# Patient Record
Sex: Female | Born: 1995 | Race: Black or African American | Hispanic: No | Marital: Single | Smoking: Current every day smoker
Health system: Southern US, Community
[De-identification: ages and names within clinical notes are randomized; demographics above are authoritative.]

## PROBLEM LIST (undated history)

## (undated) ENCOUNTER — Inpatient Hospital Stay (HOSPITAL_COMMUNITY): Payer: Self-pay

## (undated) DIAGNOSIS — R51 Headache: Secondary | ICD-10-CM

## (undated) DIAGNOSIS — B999 Unspecified infectious disease: Secondary | ICD-10-CM

## (undated) DIAGNOSIS — D649 Anemia, unspecified: Secondary | ICD-10-CM

## (undated) DIAGNOSIS — R519 Headache, unspecified: Secondary | ICD-10-CM

## (undated) HISTORY — PX: WISDOM TOOTH EXTRACTION: SHX21

## (undated) HISTORY — PX: NO PAST SURGERIES: SHX2092

---
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
## 2017-01-31 ENCOUNTER — Encounter (HOSPITAL_COMMUNITY): Payer: Self-pay | Admitting: Emergency Medicine

## 2017-01-31 ENCOUNTER — Emergency Department (HOSPITAL_COMMUNITY)
Admission: EM | Admit: 2017-01-31 | Discharge: 2017-01-31 | Disposition: A | Discharge status: Home / Self Care | Payer: Medicaid Other | Attending: Emergency Medicine | Admitting: Emergency Medicine

## 2017-01-31 DIAGNOSIS — Z3A00 Weeks of gestation of pregnancy not specified: Secondary | ICD-10-CM | POA: Insufficient documentation

## 2017-01-31 DIAGNOSIS — O219 Vomiting of pregnancy, unspecified: Secondary | ICD-10-CM | POA: Insufficient documentation

## 2017-01-31 DIAGNOSIS — Z87891 Personal history of nicotine dependence: Secondary | ICD-10-CM | POA: Insufficient documentation

## 2017-01-31 DIAGNOSIS — Z3490 Encounter for supervision of normal pregnancy, unspecified, unspecified trimester: Secondary | ICD-10-CM

## 2017-01-31 DIAGNOSIS — R112 Nausea with vomiting, unspecified: Secondary | ICD-10-CM

## 2017-01-31 DIAGNOSIS — O2310 Infections of bladder in pregnancy, unspecified trimester: Secondary | ICD-10-CM | POA: Insufficient documentation

## 2017-01-31 DIAGNOSIS — N3000 Acute cystitis without hematuria: Secondary | ICD-10-CM

## 2017-01-31 LAB — URINALYSIS, ROUTINE W REFLEX MICROSCOPIC
Bilirubin Urine: NEGATIVE
GLUCOSE, UA: NEGATIVE mg/dL
HGB URINE DIPSTICK: NEGATIVE
KETONES UR: NEGATIVE mg/dL
NITRITE: NEGATIVE
PH: 6 (ref 5.0–8.0)
Protein, ur: NEGATIVE mg/dL
Specific Gravity, Urine: 1.011 (ref 1.005–1.030)

## 2017-01-31 LAB — PREGNANCY, URINE: Preg Test, Ur: POSITIVE — AB

## 2017-01-31 MED ORDER — SODIUM CHLORIDE 0.9 % IV BOLUS (SEPSIS): 1000.0000 mL | Freq: Once | INTRAVENOUS | Status: DC

## 2017-01-31 MED ORDER — ONDANSETRON HCL 4 MG/2ML IJ SOLN
4.0000 mg | Freq: Once | INTRAMUSCULAR | Status: DC
  Filled 2017-01-31: qty 2

## 2017-01-31 MED ORDER — ONDANSETRON 4 MG PO TBDP: 4.0000 mg | ORAL_TABLET | Freq: Once | ORAL | Status: DC | PRN

## 2017-01-31 MED ORDER — DOXYLAMINE-PYRIDOXINE 10-10 MG PO TBEC: 1.0000 | DELAYED_RELEASE_TABLET | Freq: Three times a day (TID) | ORAL | 0 refills | Status: DC | PRN

## 2017-01-31 MED ORDER — ONDANSETRON 4 MG PO TBDP
4.0000 mg | ORAL_TABLET | Freq: Once | ORAL | Status: AC
  Administered 2017-01-31: 4 mg via ORAL
  Filled 2017-01-31: qty 1

## 2017-01-31 MED ORDER — CEPHALEXIN 500 MG PO CAPS: 500.0000 mg | ORAL_CAPSULE | Freq: Two times a day (BID) | ORAL | 0 refills | Status: DC

## 2017-01-31 NOTE — ED Provider Notes (Signed)
Emergency Department Provider Note   I have reviewed the triage vital signs and the nursing notes.   HISTORY  Chief Complaint Generalized Body Aches and Emesis   HPI Priscilla [name] is a 21 y.o. female presents to the emergency room in for evaluation of generalized abdominal discomfort with associated nausea, vomiting, diarrhea. He has had 3 days of symptoms have become progressively worse. She has intermittent headache and body aches as well. No sick contacts. She's not been able to tolerate PO (solids or liquids). Denies any fever but has had some chills. No dysuria, hesitancy, urgency. No vaginal bleeding or discharge. She's not seen any blood in her emesis. Denies chest pain or difficulty breathing.    Past Medical History:  Diagnosis Date  . Headache   . Infection    UTI, was dx yesterday  . Medical history non-contributory     Patient Active Problem List   Diagnosis Date Noted  . Normal delivery 12/22/2012  . ALLERGIC RHINITIS 03/22/2006  . STRABISMUS 02/25/2002    Past Surgical History:  Procedure Laterality Date  . NO PAST SURGERIES        Allergies Chocolate  Family History  Problem Relation Age of Onset  . Asthma Sister   . Diabetes Maternal Grandmother   . COPD Neg Hx   . Hypertension Neg Hx   . Stroke Neg Hx     Social History Social History  Substance Use Topics  . Smoking status: Former Smoker    Years: 1.00    Types: Cigarettes  . Smokeless tobacco: Never Used     Comment: quit this past wk  . Alcohol use No    Review of Systems  Constitutional: No fever. Positive chills and body aches.  Eyes: No visual changes. ENT: No sore throat. Cardiovascular: Denies chest pain. Respiratory: Denies shortness of breath. Gastrointestinal: Positive generalized abdominal pain. Positive nausea and vomiting.  No diarrhea.  No constipation. Genitourinary: Negative for dysuria. Musculoskeletal: Negative for back pain. Skin: Negative for  rash. Neurological: Negative for headaches, focal weakness or numbness.  10-point ROS otherwise negative.  ____________________________________________   PHYSICAL EXAM:  VITAL SIGNS: ED Triage Vitals  Enc Vitals Group     BP 01/31/17 0853 135/84     Pulse Rate 01/31/17 0853 89     Resp 01/31/17 0853 16     Temp 01/31/17 0851 98.9 F (37.2 C)     Temp Source 01/31/17 0851 Oral     SpO2 01/31/17 0853 100 %     Pain Score 01/31/17 0851 10   Constitutional: Alert and oriented. Well appearing and in no acute distress. Eyes: Conjunctivae are normal. Head: Atraumatic. Nose: No congestion/rhinnorhea. Mouth/Throat: Mucous membranes are moist.  Oropharynx non-erythematous. Neck: No stridor. Cardiovascular: Normal rate, regular rhythm. Good peripheral circulation. Grossly normal heart sounds.   Respiratory: Normal respiratory effort.  No retractions. Lungs CTAB. Gastrointestinal: Soft and nontender. No distention. No CVA tenderness.  Musculoskeletal: No lower extremity tenderness nor edema. No gross deformities of extremities. Neurologic:  Normal speech and language. No gross focal neurologic deficits are appreciated.  Skin:  Skin is warm, dry and intact. No rash noted.  ____________________________________________   LABS (all labs ordered are listed, but only abnormal results are displayed)  Labs Reviewed  URINALYSIS, ROUTINE W REFLEX MICROSCOPIC - Abnormal; Notable for the following:       Result Value   APPearance HAZY (*)    Leukocytes, UA LARGE (*)    Bacteria, UA MANY (*)  Squamous Epithelial / LPF 6-30 (*)    All other components within normal limits  PREGNANCY, URINE - Abnormal; Notable for the following:    Preg Test, Ur POSITIVE (*)    All other components within normal limits   ____________________________________________  RADIOLOGY  None ____________________________________________   PROCEDURES  Procedure(s) performed:    Procedures  None ____________________________________________   INITIAL IMPRESSION / ASSESSMENT AND PLAN / ED COURSE  Pertinent labs & imaging results that were available during my care of the patient were reviewed by me and considered in my medical decision making (see chart for details).  Patient resents to the emergency department for evaluation of generalized abdominal discomfort with nausea, vomiting, diarrhea. Symptoms consistent with a viral enteritis/gastritis. No abdominal tenderness. No pelvic symptoms. No signs or symptoms to suggest sepsis. Plan for IV fluids, nausea medication, labs, and PO challenge.   10:11 AM Updated patient regarding positive pregnancy test. With generalized abdominal discomfort I plan to follow with hCG but have very low clinical suspicion for ectopic pregnancy.   10:15 AM Patient refusing additional blood draw and IV medications. Reports that she has a 35-year-old at home and this is exactly how she felt with that pregnancy. No exam findings concerning for ectopic pregnancy at this time. Patient discomfort is mild and more general. Discussed follow up plan with OB and provided contact information with discharge paperwork. Patient is walking around the ED prior to discharge, tolerating PO, and yelling at someone on the phone. Patient does have some asymptomatic UTI on UA. Plan for Keflex for the next week. Diclegis for nausea and vomiting.   At this time, I do not feel there is any life-threatening condition present. I have reviewed and discussed all results (EKG, imaging, lab, urine as appropriate), exam findings with patient. I have reviewed nursing notes and appropriate previous records.  I feel the patient is safe to be discharged home without further emergent workup. Discussed usual and customary return precautions. Patient and family (if present) verbalize understanding and are comfortable with this plan.  Patient will follow-up with their primary care  provider. If they do not have a primary care provider, information for follow-up has been provided to them. All questions have been answered.  ____________________________________________  FINAL CLINICAL IMPRESSION(S) / ED DIAGNOSES  Final diagnoses:  Non-intractable vomiting with nausea, unspecified vomiting type  Pregnancy, unspecified gestational age  Acute cystitis without hematuria     MEDICATIONS GIVEN DURING THIS VISIT:  Medications  ondansetron (ZOFRAN-ODT) disintegrating tablet 4 mg (4 mg Oral Given 01/31/17 1046)     NEW OUTPATIENT MEDICATIONS STARTED DURING THIS VISIT:  Discharge Medication List as of 01/31/2017 11:19 AM    START taking these medications   Details  cephALEXin (KEFLEX) 500 MG capsule Take 1 capsule (500 mg total) by mouth 2 (two) times daily., Starting Thu 01/31/2017, Until Thu 02/07/2017, Print    Doxylamine-Pyridoxine 10-10 MG TBEC Take 1 tablet by mouth every 8 (eight) hours as needed., Starting Thu 01/31/2017, Until Sat 03/02/2017, Print          Note:  This document was prepared using Dragon voice recognition software and may include unintentional dictation errors.  Nanda Quinton, MD Emergency Medicine  Bobette Leyh, [facility], MD 02/01/17 [phone]

## 2017-01-31 NOTE — ED Notes (Signed)
Pt refused discharge vitals. Says she is 'ready to go'. Pt previously yelling and screaming at someone on the phone. Pt continues to be visibly upset.

## 2017-01-31 NOTE — ED Notes (Signed)
Pt informed by Dr. Laverta Baltimore of positive pregnancy result. Pt tearful. Offered community resources for pregnancy in teens. Pt declined.

## 2017-01-31 NOTE — ED Triage Notes (Signed)
Pt c/o generalized body aches and vomiting x 3 days. Pt with headache since today. Pt has tried Ibuprofen and Aleve at home with no relief.

## 2017-01-31 NOTE — ED Notes (Signed)
Pt refused additional testing or treatments. Requested medications for nausea in pregnancy.

## 2017-01-31 NOTE — Discharge Instructions (Signed)
You have been seen in the Emergency Department (ED) today for nausea and vomiting.  Your work up today has not shown a clear cause for your symptoms. You have been prescribed Diclegis; please use as prescribed as needed for your nausea.  We also found that you are pregnant. You refused further confirmatory testing but you should follow up with an OB/Gyn ASAP. Return to the ED immediately with any worsening abdominal pain. We are also treating a possible urine infection with 1 week of antibiotic.   Follow up with your doctor as soon as possible regarding today?s emergent visit and your symptoms of nausea.   Return to the ED if you develop abdominal, bloody vomiting, bloody diarrhea, if you are unable to tolerate fluids due to vomiting, or if you develop other symptoms that concern you.

## 2017-02-01 ENCOUNTER — Encounter (HOSPITAL_COMMUNITY): Payer: Self-pay | Admitting: *Deleted

## 2017-02-01 ENCOUNTER — Inpatient Hospital Stay (HOSPITAL_COMMUNITY)
Admission: AD | Admit: 2017-02-01 | Discharge: 2017-02-01 | Disposition: A | Discharge status: Home / Self Care | Payer: Medicaid Other | Source: Ambulatory Visit | Attending: Obstetrics & Gynecology | Admitting: Obstetrics & Gynecology

## 2017-02-01 ENCOUNTER — Inpatient Hospital Stay (HOSPITAL_COMMUNITY): Payer: Self-pay

## 2017-02-01 DIAGNOSIS — O200 Threatened abortion: Secondary | ICD-10-CM

## 2017-02-01 DIAGNOSIS — O26899 Other specified pregnancy related conditions, unspecified trimester: Secondary | ICD-10-CM

## 2017-02-01 DIAGNOSIS — O26891 Other specified pregnancy related conditions, first trimester: Secondary | ICD-10-CM | POA: Insufficient documentation

## 2017-02-01 DIAGNOSIS — Z3A01 Less than 8 weeks gestation of pregnancy: Secondary | ICD-10-CM | POA: Insufficient documentation

## 2017-02-01 DIAGNOSIS — O209 Hemorrhage in early pregnancy, unspecified: Secondary | ICD-10-CM

## 2017-02-01 DIAGNOSIS — R109 Unspecified abdominal pain: Secondary | ICD-10-CM | POA: Insufficient documentation

## 2017-02-01 DIAGNOSIS — O219 Vomiting of pregnancy, unspecified: Secondary | ICD-10-CM | POA: Insufficient documentation

## 2017-02-01 DIAGNOSIS — Z87891 Personal history of nicotine dependence: Secondary | ICD-10-CM | POA: Insufficient documentation

## 2017-02-01 HISTORY — DX: Headache, unspecified: R51.9

## 2017-02-01 HISTORY — DX: Headache: R51

## 2017-02-01 HISTORY — DX: Unspecified infectious disease: B99.9

## 2017-02-01 LAB — BASIC METABOLIC PANEL
ANION GAP: 8 (ref 5–15)
BUN: 6 mg/dL (ref 6–20)
CO2: 23 mmol/L (ref 22–32)
Calcium: 8.8 mg/dL — ABNORMAL LOW (ref 8.9–10.3)
Chloride: 104 mmol/L (ref 101–111)
Creatinine, Ser: 0.71 mg/dL (ref 0.44–1.00)
Glucose, Bld: 96 mg/dL (ref 65–99)
POTASSIUM: 3.6 mmol/L (ref 3.5–5.1)
SODIUM: 135 mmol/L (ref 135–145)

## 2017-02-01 LAB — WET PREP, GENITAL
CLUE CELLS WET PREP: NONE SEEN
Sperm: NONE SEEN
TRICH WET PREP: NONE SEEN
YEAST WET PREP: NONE SEEN

## 2017-02-01 LAB — URINALYSIS, ROUTINE W REFLEX MICROSCOPIC
Bilirubin Urine: NEGATIVE
GLUCOSE, UA: NEGATIVE mg/dL
HGB URINE DIPSTICK: NEGATIVE
Ketones, ur: 40 mg/dL — AB
Leukocytes, UA: NEGATIVE
Nitrite: NEGATIVE
PH: 7 (ref 5.0–8.0)
PROTEIN: NEGATIVE mg/dL
SPECIFIC GRAVITY, URINE: 1.015 (ref 1.005–1.030)

## 2017-02-01 LAB — CBC
HCT: 37.8 % (ref 36.0–46.0)
HEMOGLOBIN: 12.9 g/dL (ref 12.0–15.0)
MCH: 26.7 pg (ref 26.0–34.0)
MCHC: 34.1 g/dL (ref 30.0–36.0)
MCV: 78.3 fL (ref 78.0–100.0)
PLATELETS: 318 10*3/uL (ref 150–400)
RBC: 4.83 MIL/uL (ref 3.87–5.11)
RDW: 15.4 % (ref 11.5–15.5)
WBC: 11.1 10*3/uL — ABNORMAL HIGH (ref 4.0–10.5)

## 2017-02-01 LAB — HCG, QUANTITATIVE, PREGNANCY: HCG, BETA CHAIN, QUANT, S: 32102 m[IU]/mL — AB (ref <5)

## 2017-02-01 MED ORDER — FAMOTIDINE IN NACL 20-0.9 MG/50ML-% IV SOLN
20.0000 mg | Freq: Once | INTRAVENOUS | Status: AC
  Administered 2017-02-01: 20 mg via INTRAVENOUS
  Filled 2017-02-01: qty 50

## 2017-02-01 MED ORDER — DEXTROSE 5 % IN LACTATED RINGERS IV BOLUS
1000.0000 mL | Freq: Once | INTRAVENOUS | Status: AC
  Administered 2017-02-01: 1000 mL via INTRAVENOUS

## 2017-02-01 MED ORDER — PROMETHAZINE HCL 25 MG/ML IJ SOLN
12.5000 mg | Freq: Once | INTRAMUSCULAR | Status: AC
  Administered 2017-02-01: 12.5 mg via INTRAVENOUS
  Filled 2017-02-01: qty 1

## 2017-02-01 MED ORDER — PROMETHAZINE HCL 25 MG PO TABS: 25.0000 mg | ORAL_TABLET | Freq: Four times a day (QID) | ORAL | 0 refills | Status: DC | PRN

## 2017-02-01 NOTE — MAU Note (Addendum)
Yesterday, she found out she was pregnant.  Doesn't know how far along she is.  Keeps throwing up. Was dx with a UTI,  Did not get meds, was told it would be over $100.

## 2017-02-01 NOTE — Progress Notes (Addendum)
IV started. D5LR bolus per order.   1150: up to bathroom  1310: pt back from U/s  See previous progress notes from here on out.

## 2017-02-01 NOTE — MAU Provider Note (Signed)
History     CSN: [id number]  Arrival date and time: 02/01/17 0865  First Provider Initiated Contact with Patient 02/01/17 1050      Chief Complaint  Patient presents with  . Emesis   HPI Priscilla [name] is a 21 y.o. G2P1001 at [redacted]w[redacted]d by LMP who presents with nausea & vomiting. Symptoms began earlier this week. Reports vomiting 20+ times today and unable to keep anything down since yesterday.  Endorses lower abdominal pain x 4 days that is constant & describes as sharp & aching. Has not treated. Rates pain 8/10. Pink/red spotting in underwear since yesterday.  Denies fever/chills, diarrhea, dysuria or vaginal discharge. Went to ED yesterday & was prescribed diclegis -- currently no insurance so couldn't afford medication. Was also rx antibiotic for suspected UTI. Denies urinary symptoms. Couldn't afford abx either.   OB History    Gravida Para Term Preterm AB Living   2 1 1     1    SAB TAB Ectopic Multiple Live Births           1      Past Medical History:  Diagnosis Date  . Headache   . Infection    UTI, was dx yesterday  . Medical history non-contributory     Past Surgical History:  Procedure Laterality Date  . NO PAST SURGERIES      Family History  Problem Relation Age of Onset  . Asthma Sister   . Diabetes Maternal Grandmother   . COPD Neg Hx   . Hypertension Neg Hx   . Stroke Neg Hx     Social History  Substance Use Topics  . Smoking status: Former Smoker    Years: 1.00    Types: Cigarettes  . Smokeless tobacco: Never Used     Comment: quit this past wk  . Alcohol use No    Allergies:  Allergies  Allergen Reactions  . Chocolate Other (See Comments)    Gives the patient a really bad headache    Prescriptions Prior to Admission  Medication Sig Dispense Refill Last Dose  . cephALEXin (KEFLEX) 500 MG capsule Take 1 capsule (500 mg total) by mouth 2 (two) times daily. 14 capsule 0   . Doxylamine-Pyridoxine 10-10 MG TBEC Take 1 tablet by mouth every 8  (eight) hours as needed. 60 tablet 0   . ibuprofen (ADVIL,MOTRIN) 200 MG tablet Take 800-1,000 mg by mouth every 6 (six) hours as needed for headache.    01/30/2017 at Unknown time  . ibuprofen (ADVIL,MOTRIN) 600 MG tablet Take 1 tablet (600 mg total) by mouth every 6 (six) hours as needed for pain. (Patient not taking: Reported on 01/31/2017) 40 tablet 5 Not Taking at Unknown time  . Iron-FA-B Cmp-C-Biot-Probiotic (FUSION PLUS) CAPS Take 1 capsule by mouth daily before breakfast. (Patient not taking: Reported on 01/31/2017) 30 capsule 5 Not Taking at Unknown time  . medroxyPROGESTERone (DEPO-PROVERA) 150 MG/ML injection Inject 1 mL (150 mg total) into the muscle every 3 (three) months. (Patient not taking: Reported on 01/31/2017) 1 mL 4 Not Taking at Unknown time  . naproxen sodium (ALEVE) 220 MG tablet Take 440 mg by mouth 2 (two) times daily as needed (headache).   Past Week at Unknown time  . ondansetron (ZOFRAN) 4 MG tablet Take 1 tablet (4 mg total) by mouth every 8 (eight) hours as needed for nausea or vomiting. (Patient not taking: Reported on 01/31/2017) 10 tablet 0 Not Taking at Unknown time  . oxyCODONE-acetaminophen (PERCOCET/ROXICET) 5-325  MG per tablet Take 1-2 tablets by mouth every 4 (four) hours as needed. (Patient not taking: Reported on 01/31/2017) 40 tablet 0 Not Taking at Unknown time  . traZODone (DESYREL) 50 MG tablet Take 1 tablet (50 mg total) by mouth at bedtime as needed for sleep. (Patient not taking: Reported on 01/31/2017) 10 tablet 0 Not Taking at Unknown time    Review of Systems  Constitutional: Negative.   Gastrointestinal: Positive for abdominal pain, nausea and vomiting. Negative for constipation and diarrhea.  Genitourinary: Positive for vaginal bleeding. Negative for dysuria, flank pain, frequency, hematuria and vaginal discharge.   Physical Exam   Blood pressure (!) 104/48, pulse 84, temperature 98.6 F (37 C), temperature source Oral, resp. rate 18, weight 166 lb 8  oz (75.5 kg), last menstrual period 12/18/2016, SpO2 100 %.  Physical Exam  Nursing note and vitals reviewed. Constitutional: She is oriented to person, place, and time. She appears well-developed and well-nourished. No distress.  HENT:  Head: Normocephalic and atraumatic.  Eyes: Conjunctivae are normal. Right eye exhibits no discharge. Left eye exhibits no discharge. No scleral icterus.  Neck: Normal range of motion.  Cardiovascular: Normal rate, regular rhythm and normal heart sounds.   No murmur heard. Respiratory: Effort normal and breath sounds normal. No respiratory distress. She has no wheezes.  GI: Soft. Bowel sounds are normal. She exhibits no distension. There is no tenderness. There is no rebound and no guarding.  Genitourinary: Uterus normal. Cervix exhibits no motion tenderness and no friability. Right adnexum displays no mass and no tenderness. Left adnexum displays no mass and no tenderness. No bleeding in the vagina. Vaginal discharge (small amount of thick white discharge) found.  Genitourinary Comments: Minimal spot of bright red blood on right vaginal wall near introitus. No cervical or uterine bleeding. Cervix closed  Neurological: She is alert and oriented to person, place, and time.  Skin: Skin is warm and dry. She is not diaphoretic.  Psychiatric: She has a normal mood and affect. Her behavior is normal. Judgment and thought content normal.    MAU Course  Procedures Results for orders placed or performed during the hospital encounter of 02/01/17 (from the past 24 hour(s))  Urinalysis, Routine w reflex microscopic     Status: Abnormal   Collection Time: 02/01/17 10:07 AM  Result Value Ref Range   Color, Urine YELLOW YELLOW   APPearance CLEAR CLEAR   Specific Gravity, Urine 1.015 1.005 - 1.030   pH 7.0 5.0 - 8.0   Glucose, UA NEGATIVE NEGATIVE mg/dL   Hgb urine dipstick NEGATIVE NEGATIVE   Bilirubin Urine NEGATIVE NEGATIVE   Ketones, ur 40 (A) NEGATIVE mg/dL    Protein, ur NEGATIVE NEGATIVE mg/dL   Nitrite NEGATIVE NEGATIVE   Leukocytes, UA NEGATIVE NEGATIVE  Wet prep, genital     Status: Abnormal   Collection Time: 02/01/17 11:04 AM  Result Value Ref Range   Yeast Wet Prep HPF POC NONE SEEN NONE SEEN   Trich, Wet Prep NONE SEEN NONE SEEN   Clue Cells Wet Prep HPF POC NONE SEEN NONE SEEN   WBC, Wet Prep HPF POC MODERATE (A) NONE SEEN   Sperm NONE SEEN   CBC     Status: Abnormal   Collection Time: 02/01/17 11:10 AM  Result Value Ref Range   WBC 11.1 (H) 4.0 - 10.5 K/uL   RBC 4.83 3.87 - 5.11 MIL/uL   Hemoglobin 12.9 12.0 - 15.0 g/dL   HCT 37.8 36.0 - 46.0 %  MCV 78.3 78.0 - 100.0 fL   MCH 26.7 26.0 - 34.0 pg   MCHC 34.1 30.0 - 36.0 g/dL   RDW 15.4 11.5 - 15.5 %   Platelets 318 150 - 400 K/uL  Basic metabolic panel     Status: Abnormal   Collection Time: 02/01/17 11:10 AM  Result Value Ref Range   Sodium 135 135 - 145 mmol/L   Potassium 3.6 3.5 - 5.1 mmol/L   Chloride 104 101 - 111 mmol/L   CO2 23 22 - 32 mmol/L   Glucose, Bld 96 65 - 99 mg/dL   BUN 6 6 - 20 mg/dL   Creatinine, Ser 0.71 0.44 - 1.00 mg/dL   Calcium 8.8 (L) 8.9 - 10.3 mg/dL   GFR calc non Af Amer >60 >60 mL/min   GFR calc Af Amer >60 >60 mL/min   Anion gap 8 5 - 15  hCG, quantitative, pregnancy     Status: Abnormal   Collection Time: 02/01/17 11:10 AM  Result Value Ref Range   hCG, Beta Chain, Quant, S 32,102 (H) <5 mIU/mL   US Ob Comp Less 14 Wks  Result Date: 02/01/2017 CLINICAL DATA:  Abdominal pain and vaginal bleeding in first trimester pregnancy. EXAM: OBSTETRIC <14 WK [place] AND TRANSVAGINAL OB US TECHNIQUE: Both transabdominal and transvaginal ultrasound examinations were performed for complete evaluation of the gestation as well as the maternal uterus, adnexal regions, and pelvic cul-de-sac. Transvaginal technique was performed to assess early pregnancy. COMPARISON:  None. FINDINGS: Intrauterine gestational sac: Single Yolk sac:  Not Visualized. Embryo:  Not  Visualized. MSD: 15  mm   6 w   2  d Subchorionic hemorrhage:  None visualized. Maternal uterus/adnexae: Small left ovarian corpus luteum noted. Normal appearance of right ovary. No no mass or abnormal free fluid identified. IMPRESSION: Probable intrauterine gestational sac, but no yolk sac, fetal pole, or cardiac activity yet visualized. Recommend follow-up quantitative B-HCG levels and follow-up US in 14 days to assess viability. This recommendation follows SRU consensus guidelines: Diagnostic Criteria for Nonviable Pregnancy Early in the First Trimester. [employer] Med 2013; 503:8882-80. Electronically Signed   By: Earle Gell M.D.   On: 02/01/2017 13:30   US Ob Transvaginal  Result Date: 02/01/2017 CLINICAL DATA:  Abdominal pain and vaginal bleeding in first trimester pregnancy. EXAM: OBSTETRIC <14 WK [place] AND TRANSVAGINAL OB US TECHNIQUE: Both transabdominal and transvaginal ultrasound examinations were performed for complete evaluation of the gestation as well as the maternal uterus, adnexal regions, and pelvic cul-de-sac. Transvaginal technique was performed to assess early pregnancy. COMPARISON:  None. FINDINGS: Intrauterine gestational sac: Single Yolk sac:  Not Visualized. Embryo:  Not Visualized. MSD: 15  mm   6 w   2  d Subchorionic hemorrhage:  None visualized. Maternal uterus/adnexae: Small left ovarian corpus luteum noted. Normal appearance of right ovary. No no mass or abnormal free fluid identified. IMPRESSION: Probable intrauterine gestational sac, but no yolk sac, fetal pole, or cardiac activity yet visualized. Recommend follow-up quantitative B-HCG levels and follow-up US in 14 days to assess viability. This recommendation follows SRU consensus guidelines: Diagnostic Criteria for Nonviable Pregnancy Early in the First Trimester. [employer] Med 2013; 034:9179-15. Electronically Signed   By: Earle Gell M.D.   On: 02/01/2017 13:30     MDM +UPT UA, wet prep, GC/chlamydia, CBC, ABO/Rh, quant hCG,  HIV, and [place] today to rule out ectopic pregnancy B positive U/a -- no evidence of UTI -- will send for urine  culture IV fluids, phenergan, & pepcid BHCG 32,102. Ultrasound shows IUGS, no yolk sac, small CLC, no free fluid Discussed patient and results with Dr. Harolyn Rutherford. Will have patient f/u for repeat ultrasound in 7-10 days to assess progress of pregnancy.   Assessment and Plan  A: 1. Threatened miscarriage   2. Vaginal bleeding in pregnancy, first trimester   3. Abdominal pain affecting pregnancy   4. Nausea and vomiting during pregnancy prior to [redacted] weeks gestation    P: Discharge home Urine culture & Gc/ct pending Rx phenergan Outpatient ultrasound ordered in 7-10 days for viability Msg to [facility] for ultrasound results appt Discussed reasons to return to [place] 02/01/2017, 10:49 AM

## 2017-02-01 NOTE — Progress Notes (Addendum)
Assumed care of pt from RN J.Lab at bs.  new orders received to start IV and infuse D5LR, IV phenergan  1144: IV started. D5LR bolus per order.   1I50: up to bathroom  IV phenergan administered.   1310: pt back from U/s  1345: 12.5 mg IV phenergan with NS and pepcid administered per order. Pt stating nausea subsided from first dose and making her sleepy.   1417: pt resting quietly with eyes closed. Pt's mother at bs. Updates given regarding pt as pt's mother is enquiring.    1500: IV d/c'd with tip intact and noted. Discharge instructions given to both pt and her mother. Both verbalized understanding. Pt left unit via ambulatory with her mother.

## 2017-02-01 NOTE — Discharge Instructions (Signed)
°  Return to care   If you have heavier bleeding that soaks through more that 2 pads per hour for an hour or more  If you bleed so much that you feel like you might pass out or you do pass out  If you have significant abdominal pain that is not improved with Tylenol   If you develop a fever > 100.5      Morning Sickness Morning sickness is when you feel sick to your stomach (nauseous) during pregnancy. This nauseous feeling may or may not come with vomiting. It often occurs in the morning but can be a problem any time of day. Morning sickness is most common during the first trimester, but it may continue throughout pregnancy. While morning sickness is unpleasant, it is usually harmless unless you develop severe and continual vomiting (hyperemesis gravidarum). This condition requires more intense treatment. What are the causes? The cause of morning sickness is not completely known but seems to be related to normal hormonal changes that occur in pregnancy. What increases the risk? You are at greater risk if you:  Experienced nausea or vomiting before your pregnancy.  Had morning sickness during a previous pregnancy.  Are pregnant with more than one baby, such as twins. How is this treated? Do not use any medicines (prescription, over-the-counter, or herbal) for morning sickness without first talking to your health care provider. Your health care provider may prescribe or recommend:  Vitamin B6 supplements.  Anti-nausea medicines.  The herbal medicine ginger. Follow these instructions at home:  Only take over-the-counter or prescription medicines as directed by your health care provider.  Taking multivitamins before getting pregnant can prevent or decrease the severity of morning sickness in most women.  Eat a piece of dry toast or unsalted crackers before getting out of bed in the morning.  Eat five or six small meals a day.  Eat dry and bland foods (rice, baked potato).  Foods high in carbohydrates are often helpful.  Do not drink liquids with your meals. Drink liquids between meals.  Avoid greasy, fatty, and spicy foods.  Get someone to cook for you if the smell of any food causes nausea and vomiting.  If you feel nauseous after taking prenatal vitamins, take the vitamins at night or with a snack.  Snack on protein foods (nuts, yogurt, cheese) between meals if you are hungry.  Eat unsweetened gelatins for desserts.  Wearing an acupressure wristband (worn for sea sickness) may be helpful.  Acupuncture may be helpful.  Do not smoke.  Get a humidifier to keep the air in your house free of odors.  Get plenty of fresh air. Contact a health care provider if:  Your home remedies are not working, and you need medicine.  You feel dizzy or lightheaded.  You are losing weight. Get help right away if:  You have persistent and uncontrolled nausea and vomiting.  You pass out (faint). This information is not intended to replace advice given to you by your health care provider. Make sure you discuss any questions you have with your health care provider. Document Released: 10/25/2006 Document Revised: 02/09/2016 Document Reviewed: 02/18/2013 Elsevier Interactive Patient Education  2017 [employer].

## 2017-02-02 ENCOUNTER — Encounter (HOSPITAL_COMMUNITY): Payer: Self-pay | Admitting: Anesthesiology

## 2017-02-02 ENCOUNTER — Encounter (HOSPITAL_COMMUNITY): Payer: Self-pay

## 2017-02-02 ENCOUNTER — Inpatient Hospital Stay (HOSPITAL_COMMUNITY)
Admission: AD | Admit: 2017-02-02 | Discharge: 2017-02-02 | Disposition: A | Discharge status: Home / Self Care | Payer: Medicaid Other | Source: Ambulatory Visit | Attending: Obstetrics and Gynecology | Admitting: Obstetrics and Gynecology

## 2017-02-02 DIAGNOSIS — O26891 Other specified pregnancy related conditions, first trimester: Secondary | ICD-10-CM | POA: Insufficient documentation

## 2017-02-02 DIAGNOSIS — Z8249 Family history of ischemic heart disease and other diseases of the circulatory system: Secondary | ICD-10-CM | POA: Insufficient documentation

## 2017-02-02 DIAGNOSIS — Z79899 Other long term (current) drug therapy: Secondary | ICD-10-CM | POA: Insufficient documentation

## 2017-02-02 DIAGNOSIS — Z87891 Personal history of nicotine dependence: Secondary | ICD-10-CM | POA: Insufficient documentation

## 2017-02-02 DIAGNOSIS — O4690 Antepartum hemorrhage, unspecified, unspecified trimester: Secondary | ICD-10-CM | POA: Insufficient documentation

## 2017-02-02 DIAGNOSIS — O209 Hemorrhage in early pregnancy, unspecified: Secondary | ICD-10-CM

## 2017-02-02 DIAGNOSIS — Z825 Family history of asthma and other chronic lower respiratory diseases: Secondary | ICD-10-CM | POA: Insufficient documentation

## 2017-02-02 DIAGNOSIS — R109 Unspecified abdominal pain: Secondary | ICD-10-CM

## 2017-02-02 DIAGNOSIS — Z833 Family history of diabetes mellitus: Secondary | ICD-10-CM | POA: Insufficient documentation

## 2017-02-02 DIAGNOSIS — Z888 Allergy status to other drugs, medicaments and biological substances status: Secondary | ICD-10-CM | POA: Insufficient documentation

## 2017-02-02 DIAGNOSIS — Z823 Family history of stroke: Secondary | ICD-10-CM | POA: Insufficient documentation

## 2017-02-02 LAB — CULTURE, OB URINE: SPECIAL REQUESTS: NORMAL

## 2017-02-02 LAB — HCG, QUANTITATIVE, PREGNANCY: hCG, Beta Chain, Quant, S: 38416 m[IU]/mL — ABNORMAL HIGH (ref <5)

## 2017-02-02 MED ORDER — ONDANSETRON 4 MG PO TBDP
4.0000 mg | ORAL_TABLET | Freq: Three times a day (TID) | ORAL | 0 refills | Status: DC | PRN
Start: 2017-02-02 — End: 2017-03-03

## 2017-02-02 MED ORDER — METOCLOPRAMIDE HCL 5 MG/ML IJ SOLN
10.0000 mg | Freq: Once | INTRAMUSCULAR | Status: AC
  Administered 2017-02-02: 10 mg via INTRAVENOUS
  Filled 2017-02-02: qty 2

## 2017-02-02 MED ORDER — SODIUM CHLORIDE 0.9 % IV SOLN
8.0000 mg | Freq: Once | INTRAVENOUS | Status: AC
  Administered 2017-02-02: 8 mg via INTRAVENOUS
  Filled 2017-02-02: qty 4

## 2017-02-02 MED ORDER — RANITIDINE HCL 150 MG PO TABS: 150.0000 mg | ORAL_TABLET | Freq: Two times a day (BID) | ORAL | 0 refills | Status: DC

## 2017-02-02 MED ORDER — SCOPOLAMINE 1 MG/3DAYS TD PT72: 1.0000 | MEDICATED_PATCH | TRANSDERMAL | Status: DC

## 2017-02-02 MED ORDER — LACTATED RINGERS IV BOLUS (SEPSIS)
1000.0000 mL | Freq: Once | INTRAVENOUS | Status: AC
  Administered 2017-02-02: 1000 mL via INTRAVENOUS

## 2017-02-02 MED ORDER — FAMOTIDINE IN NACL 20-0.9 MG/50ML-% IV SOLN
20.0000 mg | Freq: Once | INTRAVENOUS | Status: AC
  Administered 2017-02-02: 20 mg via INTRAVENOUS
  Filled 2017-02-02: qty 50

## 2017-02-02 MED ORDER — OXYCODONE-ACETAMINOPHEN 5-325 MG PO TABS
1.0000 | ORAL_TABLET | Freq: Once | ORAL | Status: AC
  Administered 2017-02-02: 1 via ORAL
  Filled 2017-02-02: qty 1

## 2017-02-02 MED ORDER — DEXTROSE 5 % IN LACTATED RINGERS IV BOLUS
1000.0000 mL | Freq: Once | INTRAVENOUS | Status: AC
  Administered 2017-02-02: 1000 mL via INTRAVENOUS

## 2017-02-02 NOTE — Progress Notes (Addendum)
G2P1 @ [redacted]wksga. Back again from yesterday for same thing in addition to scant bleeding that started an hr ago. Scant bleeding noted on pt's undergarment. Pt was on PO phenergan at home and stating not helping with nausea and vomiting. See flow sheet for details of VS. High BP noted   Provider at bs assessing. Orders received to start IV and admin IVP zofran   IV 20 q placed on right AC. flowing slower than normal and pt complaints it hurts. IV d/c'd.   1600: Lab at bs attempting to draw labs. Finally able to at 1607.   1607: another nurse called to start IV. No luck. House coverage called to start IV.   1620: anesthesia at bs  1632: Placed 20 g IV Left front of wrist. Fluid bolus infusing. Zofran started.   1720: pt feeling much better. Resting quietly with eyes clsoed.   1825: another bag of D5LR up hung by another nurse 1800: Pt denies nausea now. PO percocet given.   1900: Provider at bs discussing POC.   Discharge instructions given with pt understanding. Pt left unit via ambulatory.

## 2017-02-02 NOTE — MAU Note (Signed)
Onset of vaginal bleeding about one hour ago after getting out of shower, has not eaten in 3 days, was prescribed Phenergan and she vomits after taking.  Having abdominal pain 10/10

## 2017-02-02 NOTE — Discharge Instructions (Signed)
Nausea and Vomiting, Adult Feeling sick to your stomach (nausea) means that your stomach is upset or you feel like you have to throw up (vomit). Feeling more and more sick to your stomach can lead to throwing up. Throwing up happens when food and liquid from your stomach are thrown up and out the mouth. Throwing up can make you feel weak and cause you to get dehydrated. Dehydration can make you tired and thirsty, make you have a dry mouth, and make it so you pee (urinate) less often. Older adults and people with other diseases or a weak defense system (immune system) are at higher risk for dehydration. If you feel sick to your stomach or if you throw up, it is important to follow instructions from your doctor about how to take care of yourself. Follow these instructions at home: Eating and drinking  Follow these instructions as told by your doctor:  Take an oral rehydration solution (ORS). This is a drink that is sold at pharmacies and stores.  Drink clear fluids in small amounts as you are able, such as:  Water.  Ice chips.  Diluted fruit juice.  Low-calorie sports drinks.  Eat bland, easy-to-digest foods in small amounts as you are able, such as:  Bananas.  Applesauce.  Rice.  Low-fat (lean) meats.  Toast.  Crackers.  Avoid fluids that have a lot of sugar or caffeine in them.  Avoid alcohol.  Avoid spicy or fatty foods. General instructions   Drink enough fluid to keep your pee (urine) clear or pale yellow.  Wash your hands often. If you cannot use soap and water, use hand sanitizer.  Make sure that all people in your home wash their hands well and often.  Take over-the-counter and prescription medicines only as told by your doctor.  Rest at home while you get better.  Watch your condition for any changes.  Breathe slowly and deeply when you feel sick to your stomach.  Keep all follow-up visits as told by your doctor. This is important. Contact a doctor  if:  You have a fever.  You cannot keep fluids down.  Your symptoms get worse.  You have new symptoms.  You feel sick to your stomach for more than two days.  You feel light-headed or dizzy.  You have a headache.  You have muscle cramps. Get help right away if:  You have pain in your chest, neck, arm, or jaw.  You feel very weak or you pass out (faint).  You throw up again and again.  You see blood in your throw-up.  Your throw-up looks like black coffee grounds.  You have bloody or black poop (stools) or poop that look like tar.  You have a very bad headache, a stiff neck, or both.  You have a rash.  You have very bad pain, cramping, or bloating in your belly (abdomen).  You have trouble breathing.  You are breathing very quickly.  Your heart is beating very quickly.  Your skin feels cold and clammy.  You feel confused.  You have pain when you pee.  You have signs of dehydration, such as:  Dark pee, hardly any pee, or no pee.  Cracked lips.  Dry mouth.  Sunken eyes.  Sleepiness.  Weakness. These symptoms may be an emergency. Do not wait to see if the symptoms will go away. Get medical help right away. Call your local emergency services (911 in the [place] Do not drive yourself to the hospital. This information   is not intended to replace advice given to you by your health care provider. Make sure you discuss any questions you have with your health care provider. Document Released: 02/20/2008 Document Revised: 03/23/2016 Document Reviewed: 05/10/2015 Elsevier Interactive Patient Education  2017 [employer] Inc[employer]  

## 2017-02-02 NOTE — MAU Provider Note (Signed)
History     CSN: [id number]  Arrival date and time: 02/02/17 1426   First Provider Initiated Contact with Patient 02/02/17 1534      Chief Complaint  Patient presents with  . Abdominal Pain  . Vaginal Bleeding   HPI   Priscilla [name] is a 21 y.o. female G2P1001 @ [redacted]w[redacted]d here in MAU with onset of bright red vaginal spotting and worsening N/V. She was seen yesterday for abdominal pain and NV. She was given IV fluids and left still feeling sick on her stomach. Since she left yesterday she has vomited over 20 times. Says she has not eaten in over 2 days. She is taking phenergan but does not feel it is working.   OB History    Gravida Para Term Preterm AB Living   2 1 1     1    SAB TAB Ectopic Multiple Live Births           1      Past Medical History:  Diagnosis Date  . Headache   . Infection    UTI, was dx yesterday  . Medical history non-contributory     Past Surgical History:  Procedure Laterality Date  . NO PAST SURGERIES      Family History  Problem Relation Age of Onset  . Asthma Sister   . Diabetes Maternal Grandmother   . COPD Neg Hx   . Hypertension Neg Hx   . Stroke Neg Hx     Social History  Substance Use Topics  . Smoking status: Former Smoker    Years: 1.00    Types: Cigarettes  . Smokeless tobacco: Never Used     Comment: quit this past wk  . Alcohol use No    Allergies:  Allergies  Allergen Reactions  . Chocolate Other (See Comments)    Gives the patient a really bad headache    Prescriptions Prior to Admission  Medication Sig Dispense Refill Last Dose  . Doxylamine-Pyridoxine 10-10 MG TBEC Take 1 tablet by mouth every 8 (eight) hours as needed. 60 tablet 0 01/31/2017 at Unknown time  . promethazine (PHENERGAN) 25 MG tablet Take 1 tablet (25 mg total) by mouth every 6 (six) hours as needed for nausea or vomiting. 30 tablet 0    Results for orders placed or performed during the hospital encounter of 02/02/17 (from the past 48 hour(s))   hCG, quantitative, pregnancy     Status: Abnormal   Collection Time: 02/02/17  4:03 PM  Result Value Ref Range   hCG, Beta Chain, Quant, S 38,416 (H) <5 mIU/mL    Comment:          GEST. AGE      CONC.  (mIU/mL)   <=1 WEEK        5 - 50     2 WEEKS       50 - 500     3 WEEKS       100 - 10,000     4 WEEKS     1,000 - 30,000     5 WEEKS     3,500 - 115,000   6-8 WEEKS     12,000 - 270,000    12 WEEKS     15,000 - 220,000        FEMALE AND NON-PREGNANT FEMALE:     LESS THAN 5 mIU/mL    Results for orders placed or performed during the hospital encounter of 02/02/17 (from the past 48 hour(s))  hCG, quantitative, pregnancy     Status: Abnormal   Collection Time: 02/02/17  4:03 PM  Result Value Ref Range   hCG, Beta Chain, Quant, S 38,416 (H) <5 mIU/mL    Comment:          GEST. AGE      CONC.  (mIU/mL)   <=1 WEEK        5 - 50     2 WEEKS       50 - 500     3 WEEKS       100 - 10,000     4 WEEKS     1,000 - 30,000     5 WEEKS     3,500 - 115,000   6-8 WEEKS     12,000 - 270,000    12 WEEKS     15,000 - 220,000        FEMALE AND NON-PREGNANT FEMALE:     LESS THAN 5 mIU/mL    Review of Systems  Constitutional: Negative for fever.  Gastrointestinal: Negative for abdominal pain and constipation.  Genitourinary: Positive for vaginal bleeding.   Physical Exam   Blood pressure (!) 114/47, pulse 88, temperature 98.6 F (37 C), resp. rate 18, height 5' (1.524 m), weight 166 lb (75.3 kg), last menstrual period 12/18/2016.  Physical Exam  Constitutional: She is oriented to person, place, and time. She appears well-developed and well-nourished. No distress.  HENT:  Head: Normocephalic.  GI: Soft. She exhibits no distension and no mass. There is no tenderness. There is no rebound and no guarding.  Genitourinary:  Genitourinary Comments: Bimanual exam: cervix closed, posterior, scant amount of brown blood noted on exam glove.   Musculoskeletal: Normal range of motion.   Neurological: She is alert and oriented to person, place, and time.  Skin: Skin is warm. She is not diaphoretic.  Psychiatric: Her behavior is normal.    MAU Course  Procedures  None  MDM  LR bolus X1 D5LR bolus X1 Zofran 8 mg IV Pepcid 20 mg IV  Reglan 10 mg IV Percocet 1 tab.  Patient says she is feeling much better. Patient tolerating ice chips.   Assessment and Plan   A:  1. Abdominal pain in pregnancy, first trimester   2. Vaginal bleeding in pregnancy, first trimester     P:  Discharge home in stable condition Rx: Zofran, Zantac Return to MAU if symptoms worsen Patient has [place] scheduled for 7 days Take medication as prescribed Scopolamine patch ordered, however RN informed me that it was not placed at discharge.   Lezlie Lye, NP 02/02/2017 7:26 PM

## 2017-02-04 LAB — GC/CHLAMYDIA PROBE AMP (~~LOC~~) NOT AT ARMC
Chlamydia: NEGATIVE
NEISSERIA GONORRHEA: NEGATIVE

## 2017-02-08 ENCOUNTER — Ambulatory Visit (HOSPITAL_COMMUNITY)
Admission: RE | Admit: 2017-02-08 | Discharge: 2017-02-08 | Disposition: A | Discharge status: Home / Self Care | Payer: Medicaid Other | Source: Ambulatory Visit | Attending: Student | Admitting: Student

## 2017-02-08 DIAGNOSIS — O26899 Other specified pregnancy related conditions, unspecified trimester: Secondary | ICD-10-CM | POA: Insufficient documentation

## 2017-02-08 DIAGNOSIS — R109 Unspecified abdominal pain: Secondary | ICD-10-CM | POA: Insufficient documentation

## 2017-02-08 DIAGNOSIS — O200 Threatened abortion: Secondary | ICD-10-CM | POA: Insufficient documentation

## 2017-02-08 DIAGNOSIS — Z3A00 Weeks of gestation of pregnancy not specified: Secondary | ICD-10-CM | POA: Insufficient documentation

## 2017-03-03 ENCOUNTER — Inpatient Hospital Stay (HOSPITAL_COMMUNITY): Payer: Self-pay

## 2017-03-03 ENCOUNTER — Inpatient Hospital Stay (HOSPITAL_COMMUNITY)
Admission: AD | Admit: 2017-03-03 | Discharge: 2017-03-03 | Disposition: A | Discharge status: Home / Self Care | Payer: Self-pay | Source: Ambulatory Visit | Attending: Obstetrics & Gynecology | Admitting: Obstetrics & Gynecology

## 2017-03-03 ENCOUNTER — Encounter (HOSPITAL_COMMUNITY): Payer: Self-pay

## 2017-03-03 DIAGNOSIS — N390 Urinary tract infection, site not specified: Secondary | ICD-10-CM | POA: Insufficient documentation

## 2017-03-03 DIAGNOSIS — Z87891 Personal history of nicotine dependence: Secondary | ICD-10-CM | POA: Insufficient documentation

## 2017-03-03 DIAGNOSIS — O209 Hemorrhage in early pregnancy, unspecified: Secondary | ICD-10-CM

## 2017-03-03 DIAGNOSIS — O034 Incomplete spontaneous abortion without complication: Secondary | ICD-10-CM

## 2017-03-03 DIAGNOSIS — O039 Complete or unspecified spontaneous abortion without complication: Secondary | ICD-10-CM | POA: Insufficient documentation

## 2017-03-03 LAB — URINALYSIS, ROUTINE W REFLEX MICROSCOPIC
BACTERIA UA: NONE SEEN
BILIRUBIN URINE: NEGATIVE
Glucose, UA: NEGATIVE mg/dL
KETONES UR: NEGATIVE mg/dL
Nitrite: NEGATIVE
PH: 6 (ref 5.0–8.0)
Protein, ur: NEGATIVE mg/dL
SPECIFIC GRAVITY, URINE: 1.017 (ref 1.005–1.030)

## 2017-03-03 MED ORDER — IBUPROFEN 600 MG PO TABS: 600.0000 mg | ORAL_TABLET | Freq: Four times a day (QID) | ORAL | 0 refills | Status: DC | PRN

## 2017-03-03 MED ORDER — MISOPROSTOL 200 MCG PO TABS
800.0000 ug | ORAL_TABLET | Freq: Once | ORAL | Status: AC
  Administered 2017-03-03: 800 ug via BUCCAL
  Filled 2017-03-03: qty 4

## 2017-03-03 NOTE — Discharge Instructions (Signed)
Expect the clinic to call you this week and schedule you an appointment to be seen in 2 weeks. Get your medication from your pharmacy to take to help with cramping.  Take this medicine with food. Return if you develop fever, severe vaginal bleeding with dizziness. Drink at least 8 8-oz glasses of water every day. Take Tylenol 325 mg 2 tablets by mouth every 4 hours if needed for pain. You can take this along with the ibuprofen.

## 2017-03-03 NOTE — MAU Provider Note (Signed)
History     CSN: [id number]  Arrival date and time: 03/03/17 4259   First Provider Initiated Contact with Patient 03/03/17 1029      Chief Complaint  Patient presents with  . Vaginal Bleeding   HPI Priscilla [name] 21 y.o.  Comes to MAU today with increased vaginal bleeding and abdominal cramping especially on the left side.  Bleeding was heavier yesterday reporting similar to a menstrual period but today did not wear a pad when coming in to the hospital.  Was here last on 02-02-17 and note was reviewed.  Blood type is B positive.   OB History    Gravida Para Term Preterm AB Living   2 1 1     1    SAB TAB Ectopic Multiple Live Births           1      Past Medical History:  Diagnosis Date  . Headache   . Infection    UTI, was dx yesterday    Past Surgical History:  Procedure Laterality Date  . WISDOM TOOTH EXTRACTION      Family History  Problem Relation Age of Onset  . Asthma Sister   . Diabetes Maternal Grandmother   . COPD Neg Hx   . Hypertension Neg Hx   . Stroke Neg Hx     Social History  Substance Use Topics  . Smoking status: Former Smoker    Years: 1.00    Types: Cigarettes  . Smokeless tobacco: Never Used     Comment: quit this past wk  . Alcohol use No    Allergies:  Allergies  Allergen Reactions  . Chocolate Other (See Comments)    Reaction:  Headaches     Prescriptions Prior to Admission  Medication Sig Dispense Refill Last Dose  . ondansetron (ZOFRAN ODT) 4 MG disintegrating tablet Take 1 tablet (4 mg total) by mouth every 8 (eight) hours as needed for nausea or vomiting. 20 tablet 0   . promethazine (PHENERGAN) 25 MG tablet Take 1 tablet (25 mg total) by mouth every 6 (six) hours as needed for nausea or vomiting. 30 tablet 0 02/02/2017 at Unknown time  . ranitidine (ZANTAC) 150 MG tablet Take 1 tablet (150 mg total) by mouth 2 (two) times daily. 60 tablet 0     Review of Systems  Constitutional: Negative for fever.  Genitourinary:  Positive for vaginal bleeding.       Pain on lower left side   Physical Exam   Blood pressure 129/85, pulse (!) 109, temperature 98.9 F (37.2 C), temperature source Oral, resp. rate 16, height 5\' 1"  (1.549 m), weight 169 lb 0.6 oz (76.7 kg), last menstrual period 12/18/2016.  Physical Exam  Nursing note and vitals reviewed. Constitutional: She is oriented to person, place, and time. She appears well-developed and well-nourished.  HENT:  Head: Normocephalic.  Eyes: EOM are normal.  Neck: Neck supple.  GI: Soft. There is no tenderness.  Unable to hear FHT with doppler  Musculoskeletal: Normal range of motion.  Neurological: She is alert and oriented to person, place, and time.  Skin: Skin is warm and dry.  Psychiatric: She has a normal mood and affect.    MAU Course  Procedures Preliminary ultrasound report reviewed - only mobile IUGS sac seen today when yolk sac seen at last ultrasound on 02-08-17.  Result is failed pregnancy  MDM 1150  Discussed ultrasound results and plan of care with Dr. Harolyn Rutherford 1230  Discussed ultrasound results from  3 weeks ago and compared to today's ultrasound.  Client tearful and wanting to discuss plans with her mother by phone.  Discussed expectant management and cytotec medication. Decided to get Cytotec here today.  Blood type B positive.  HGB on 02-01-17 was 12.9  Has had minimal vaginal bleeding yesterday.  Assessment and Plan  Failed pregnancy - beginning miscarriage  Plan Cytotec 800 mcg given buccally in MAU Message sent to clinic to schedule for follow up in 2 weeks. Drink at least 8 8-oz glasses of water every day. Prescribed ibuprofen to her pharmacy to take for cramping.   Terri L Burleson 03/03/2017, 11:04 AM

## 2017-03-03 NOTE — MAU Note (Signed)
Started bleeding yesterday like a period, only used one pad, not wearing one currently.

## 2017-03-09 ENCOUNTER — Inpatient Hospital Stay (HOSPITAL_COMMUNITY)
Admission: AD | Admit: 2017-03-09 | Discharge: 2017-03-09 | Disposition: A | Discharge status: Home / Self Care | Payer: Medicaid Other | Source: Ambulatory Visit | Attending: Obstetrics and Gynecology | Admitting: Obstetrics and Gynecology

## 2017-03-09 DIAGNOSIS — O039 Complete or unspecified spontaneous abortion without complication: Secondary | ICD-10-CM

## 2017-03-09 DIAGNOSIS — Z79899 Other long term (current) drug therapy: Secondary | ICD-10-CM | POA: Insufficient documentation

## 2017-03-09 DIAGNOSIS — Z87891 Personal history of nicotine dependence: Secondary | ICD-10-CM | POA: Insufficient documentation

## 2017-03-09 DIAGNOSIS — K047 Periapical abscess without sinus: Secondary | ICD-10-CM

## 2017-03-09 DIAGNOSIS — R51 Headache: Secondary | ICD-10-CM | POA: Insufficient documentation

## 2017-03-09 MED ORDER — OXYCODONE-ACETAMINOPHEN 5-325 MG PO TABS
2.0000 | ORAL_TABLET | Freq: Once | ORAL | Status: AC
  Administered 2017-03-09: 2 via ORAL
  Filled 2017-03-09: qty 2

## 2017-03-09 MED ORDER — HYDROCODONE-ACETAMINOPHEN 5-325 MG PO TABS: 1.0000 | ORAL_TABLET | ORAL | 0 refills | Status: DC | PRN

## 2017-03-09 MED ORDER — AMOXICILLIN-POT CLAVULANATE 875-125 MG PO TABS: 1.0000 | ORAL_TABLET | Freq: Two times a day (BID) | ORAL | 0 refills | Status: DC

## 2017-03-09 NOTE — Discharge Instructions (Signed)
Dental Abscess A dental abscess is pus in or around a tooth. Follow these instructions at home:  Take medicines only as told by your dentist.  If you were prescribed antibiotic medicine, finish all of it even if you start to feel better.  Rinse your mouth (gargle) often with salt water.  Do not drive or use heavy machinery, like a lawn mower, while taking pain medicine.  Do not apply heat to the outside of your mouth.  Keep all follow-up visits as told by your dentist. This is important. Contact a doctor if:  Your pain is worse, and medicine does not help. Get help right away if:  You have a fever or chills.  Your symptoms suddenly get worse.  You have a very bad headache.  You have problems breathing or swallowing.  You have trouble opening your mouth.  You have puffiness (swelling) in your neck or around your eye. This information is not intended to replace advice given to you by your health care provider. Make sure you discuss any questions you have with your health care provider. Document Released: 01/18/2015 Document Revised: 02/09/2016 Document Reviewed: 08/31/2014 Elsevier Interactive Patient Education  2018 [employer] Inc[employer]  

## 2017-03-09 NOTE — MAU Note (Signed)
+   lowerabdominal pain Has persistent since miscarriage x3 days ago per patient Constant Rating pain 10/10 Sharp in nature Has tried tylenol and aleve with no relief.  + right lower jaw/ear pain Pain started today Constant pain--dull  Patient tearful during triage.  States unable to leave urine sample at this time.

## 2017-03-09 NOTE — MAU Provider Note (Signed)
History     CSN: [id number]  Arrival date and time: 03/09/17 1031   None     No chief complaint on file.  HPI   [name] is a 21 y.o. female G2P1001 here in MAU with abdominal pain and tooth pain. She was diagnosed with an SAB on 6/17 and received cytotec in MAU. She had a lot of abdominal pain and rectal pain and passed a large clot. Her bleeding since then has slowed down. She also attests to right tooth pain and right facial pain/ swelling. The pain started yesterday.   OB History    Gravida Para Term Preterm AB Living   2 1 1     1    SAB TAB Ectopic Multiple Live Births           1      Past Medical History:  Diagnosis Date  . Headache   . Infection    UTI, was dx yesterday    Past Surgical History:  Procedure Laterality Date  . WISDOM TOOTH EXTRACTION      Family History  Problem Relation Age of Onset  . Asthma Sister   . Diabetes Maternal Grandmother   . COPD Neg Hx   . Hypertension Neg Hx   . Stroke Neg Hx     Social History  Substance Use Topics  . Smoking status: Former Smoker    Years: 1.00    Types: Cigarettes  . Smokeless tobacco: Never Used     Comment: quit this past wk  . Alcohol use No    Allergies:  Allergies  Allergen Reactions  . Chocolate Other (See Comments)    Reaction:  Migraines    Prescriptions Prior to Admission  Medication Sig Dispense Refill Last Dose  . acetaminophen (TYLENOL) 500 MG tablet Take 1,000 mg by mouth once.   03/08/2017 at Unknown time  . diphenhydramine-acetaminophen (TYLENOL PM) 25-500 MG TABS tablet Take 1 tablet by mouth once.   03/09/2017 at Unknown time  . naproxen sodium (ANAPROX) 220 MG tablet Take 440 mg by mouth once.   03/09/2017 at Unknown time  . promethazine (PHENERGAN) 25 MG tablet Take 1 tablet (25 mg total) by mouth every 6 (six) hours as needed for nausea or vomiting. 30 tablet 0 Past Week at Unknown time  . ibuprofen (ADVIL,MOTRIN) 600 MG tablet Take 1 tablet (600 mg total) by mouth  every 6 (six) hours as needed. (Patient not taking: Reported on 03/09/2017) 30 tablet 0 Not Taking at Unknown time   No results found for this or any previous visit (from the past 48 hour(s)).  Review of Systems  Gastrointestinal: Positive for abdominal pain.  Genitourinary: Positive for vaginal bleeding.   Physical Exam   Blood pressure 128/64, pulse 87, temperature 98.4 F (36.9 C), temperature source Oral, resp. rate 17, weight 172 lb 0.6 oz (78 kg), last menstrual period 12/18/2016, SpO2 100 %.  Physical Exam  Constitutional: She is oriented to person, place, and time. She appears well-developed and well-nourished. No distress.  HENT:  Head: Normocephalic.  Mouth/Throat: Dental abscesses and dental caries present. No uvula swelling.    Respiratory: Effort normal.  GI: Soft.  Genitourinary:  Genitourinary Comments: Vagina - Small amount of brown vaginal discharge, no odor  Cervix - No contact bleeding, no active bleeding  Bimanual exam: Cervix closed Uterus non tender, enlarged  Adnexa non tender, no masses bilaterally Chaperone present for exam.   Musculoskeletal: Normal range of motion.  Neurological: She is  alert and oriented to person, place, and time.  Skin: Skin is warm. She is not diaphoretic.  Psychiatric: Her behavior is normal.    MAU Course  Procedures  None  MDM  Percocet given 2 tabs in MAU B positive blood type.   Assessment and Plan   A:  1. SAB (spontaneous abortion)   2. Tooth abscess     P:  Discharge home in stable condition  Keep your appointment with [facility].  Return to MAU if symptoms worsen Rx: Vicodin, Augmentin

## 2017-03-12 ENCOUNTER — Encounter: Payer: Self-pay | Admitting: Certified Nurse Midwife

## 2017-03-19 ENCOUNTER — Ambulatory Visit: Payer: Medicaid Other | Admitting: Obstetrics

## 2017-03-19 ENCOUNTER — Encounter: Payer: Self-pay | Admitting: *Deleted

## 2017-07-28 ENCOUNTER — Emergency Department (HOSPITAL_COMMUNITY)
Admission: EM | Admit: 2017-07-28 | Discharge: 2017-07-28 | Disposition: A | Discharge status: Home / Self Care | Payer: Medicaid Other | Source: Home / Self Care | Attending: Emergency Medicine | Admitting: Emergency Medicine

## 2017-07-28 ENCOUNTER — Other Ambulatory Visit: Payer: Self-pay

## 2017-07-28 ENCOUNTER — Encounter (HOSPITAL_COMMUNITY): Payer: Self-pay

## 2017-07-28 ENCOUNTER — Emergency Department (HOSPITAL_COMMUNITY)
Admission: EM | Admit: 2017-07-28 | Discharge: 2017-07-28 | Disposition: A | Discharge status: Home / Self Care | Payer: Medicaid Other | Attending: Emergency Medicine | Admitting: Emergency Medicine

## 2017-07-28 ENCOUNTER — Encounter (HOSPITAL_COMMUNITY): Payer: Self-pay | Admitting: Emergency Medicine

## 2017-07-28 DIAGNOSIS — K0889 Other specified disorders of teeth and supporting structures: Secondary | ICD-10-CM | POA: Diagnosis present

## 2017-07-28 DIAGNOSIS — Z87891 Personal history of nicotine dependence: Secondary | ICD-10-CM | POA: Diagnosis not present

## 2017-07-28 DIAGNOSIS — K029 Dental caries, unspecified: Secondary | ICD-10-CM

## 2017-07-28 MED ORDER — IBUPROFEN 600 MG PO TABS: 600.0000 mg | ORAL_TABLET | Freq: Three times a day (TID) | ORAL | 0 refills | Status: DC | PRN

## 2017-07-28 MED ORDER — IBUPROFEN 200 MG PO TABS
600.0000 mg | ORAL_TABLET | Freq: Once | ORAL | Status: AC
  Administered 2017-07-28: 600 mg via ORAL
  Filled 2017-07-28: qty 3

## 2017-07-28 MED ORDER — PENICILLIN V POTASSIUM 500 MG PO TABS: 500.0000 mg | ORAL_TABLET | Freq: Three times a day (TID) | ORAL | 0 refills | Status: DC

## 2017-07-28 MED ORDER — PENICILLIN V POTASSIUM 500 MG PO TABS
500.0000 mg | ORAL_TABLET | Freq: Once | ORAL | Status: AC
  Administered 2017-07-28: 500 mg via ORAL
  Filled 2017-07-28: qty 1

## 2017-07-28 MED ORDER — BUPIVACAINE-EPINEPHRINE (PF) 0.5% -1:200000 IJ SOLN
1.8000 mL | Freq: Once | INTRAMUSCULAR | Status: AC
  Administered 2017-07-28: 1.8 mL
  Filled 2017-07-28 (×2): qty 1.8

## 2017-07-28 MED ORDER — ACETAMINOPHEN 500 MG PO TABS
1000.0000 mg | ORAL_TABLET | Freq: Once | ORAL | Status: AC
  Administered 2017-07-28: 1000 mg via ORAL
  Filled 2017-07-28: qty 2

## 2017-07-28 MED ORDER — ACETAMINOPHEN 325 MG PO TABS
650.0000 mg | ORAL_TABLET | Freq: Once | ORAL | Status: AC
  Administered 2017-07-28: 650 mg via ORAL
  Filled 2017-07-28: qty 2

## 2017-07-28 NOTE — Discharge Instructions (Signed)
Please call a dentist for follow up

## 2017-07-28 NOTE — ED Provider Notes (Signed)
[place] DEPT Provider Note   CSN: [id number] Arrival date & time: 07/28/17  0750     History   Chief Complaint Chief Complaint  Patient presents with  . Dental Pain    HPI Priscilla [name] is a 21 y.o. female.  HPI Patient is a 21 year old female presents the emergency department with right lower dental pain over the past 2 days.  She is called a Pharmacist, community but has not seen them in follow-up yet.  No fevers or chills.  No difficulty breathing or swallowing.  Pain is moderate to severe in severity.  Pain is unrelieved with ibuprofen.   Past Medical History:  Diagnosis Date  . Headache   . Infection    UTI, was dx yesterday    Patient Active Problem List   Diagnosis Date Noted  . Normal delivery 12/22/2012  . ALLERGIC RHINITIS 03/22/2006  . STRABISMUS 02/25/2002    Past Surgical History:  Procedure Laterality Date  . WISDOM TOOTH EXTRACTION      OB History    Gravida Para Term Preterm AB Living   2 1 1     1    SAB TAB Ectopic Multiple Live Births           1       Home Medications    Prior to Admission medications   Medication Sig Start Date End Date Taking? Authorizing Provider  ALPRAZolam Duanne Moron) 1 MG tablet Take 1 mg 2 (two) times daily as needed by mouth for anxiety.  07/24/17  Yes [provider]  amoxicillin-clavulanate (AUGMENTIN) 875-125 MG tablet Take 1 tablet by mouth 2 (two) times daily. 03/09/17  Yes Rasch, Anderson Malta I, NP  ibuprofen (ADVIL,MOTRIN) 200 MG tablet Take 800 mg every 6 (six) hours as needed by mouth for mild pain.   Yes [provider]  naproxen sodium (ANAPROX) 220 MG tablet Take 440 mg by mouth once.   Yes [provider]  HYDROcodone-acetaminophen (NORCO/VICODIN) 5-325 MG tablet Take 1-2 tablets by mouth every 4 (four) hours as needed. Patient not taking: Reported on 07/28/2017 03/09/17   Rasch, Anderson Malta I, NP  ibuprofen (ADVIL,MOTRIN) 600 MG tablet Take 1 tablet (600 mg total)  by mouth every 6 (six) hours as needed. Patient not taking: Reported on 03/09/2017 03/03/17   Virginia Rochester, NP  promethazine (PHENERGAN) 25 MG tablet Take 1 tablet (25 mg total) by mouth every 6 (six) hours as needed for nausea or vomiting. Patient not taking: Reported on 07/28/2017 02/01/17   Jorje Guild, NP    Family History Family History  Problem Relation Age of Onset  . Asthma Sister   . Diabetes Maternal Grandmother   . COPD Neg Hx   . Hypertension Neg Hx   . Stroke Neg Hx     Social History Social History   Tobacco Use  . Smoking status: Former Smoker    Years: 1.00    Types: Cigarettes  . Smokeless tobacco: Never Used  . Tobacco comment: quit this past wk  Substance Use Topics  . Alcohol use: No  . Drug use: No     Allergies   Chocolate   Review of Systems Review of Systems  All other systems reviewed and are negative.    Physical Exam Updated Vital Signs BP 140/90 (BP Location: Right Arm)   Pulse 74   Temp 98 F (36.7 C) (Oral)   Resp 20   Ht 5\' 1"  (1.549 m)   Wt 76.2 kg (168  lb)   LMP 12/18/2016   SpO2 100%   BMI 31.74 kg/m   Physical Exam  Constitutional: She is oriented to person, place, and time. She appears well-developed and well-nourished.  HENT:  Head: Normocephalic.  Right lower second molar tenderness without gingival swelling or fluctuance.  Tolerating secretions.  Oral airway patent.  Space under her tongue is soft.  Eyes: EOM are normal.  Neck: Normal range of motion.  Pulmonary/Chest: Effort normal.  Abdominal: She exhibits no distension.  Musculoskeletal: Normal range of motion.  Neurological: She is alert and oriented to person, place, and time.  Psychiatric: She has a normal mood and affect.  Nursing note and vitals reviewed.    ED Treatments / Results  Labs (all labs ordered are listed, but only abnormal results are displayed) Labs Reviewed - No data to display  EKG  EKG Interpretation None        Radiology No results found.  Procedures Procedures (including critical care time)  Medications Ordered in ED Medications  penicillin v potassium (VEETID) tablet 500 mg (not administered)  acetaminophen (TYLENOL) tablet 650 mg (not administered)  ibuprofen (ADVIL,MOTRIN) tablet 600 mg (not administered)     Initial Impression / Assessment and Plan / ED Course  I have reviewed the triage vital signs and the nursing notes.  Pertinent labs & imaging results that were available during my care of the patient were reviewed by me and considered in my medical decision making (see chart for details).     Dental Pain. Home with antibiotics and pain medicine. Recommend dental follow up. No signs of gingival abscess. Tolerating secretions. Airway patent. No sub lingular swelling   Final Clinical Impressions(s) / ED Diagnoses   Final diagnoses:  Pain, dental    ED Discharge Orders    None       Jola Schmidt, MD 07/28/17 [id number]

## 2017-07-28 NOTE — ED Notes (Signed)
Bed: GN56 Expected date:  Expected time:  Means of arrival:  Comments: 21 yo abd pain x2 mths

## 2017-07-28 NOTE — ED Triage Notes (Signed)
Patient reports persistent right lower molar pain onset yesterday unrelieved by OTC Ibuprofen , seen here this morning for the same complaint .

## 2017-07-28 NOTE — ED Provider Notes (Signed)
[facility] EMERGENCY DEPARTMENT Provider Note   CSN: [id number] Arrival date & time: 07/28/17  2132     History   Chief Complaint Chief Complaint  Patient presents with  . Dental Pain    HPI Priscilla [name] is a 21 y.o. female with no major medical problems presents to the Emergency Department complaining of gradual, persistent, progressively worsening RL dental pain onset 2 days ago. Associated symptoms include otalgia.  Pt reports taking her PCN and ibuprofen as prescribed this morning without relief.  Nothing makes it better and eating and drinking makes it worse.  Pt denies fever, chills, trismus, neck pain, neck stiffness, vomiting or diarrhea.      The history is provided by the patient and medical records. No language interpreter was used.    Past Medical History:  Diagnosis Date  . Headache   . Infection    UTI, was dx yesterday    Patient Active Problem List   Diagnosis Date Noted  . Normal delivery 12/22/2012  . ALLERGIC RHINITIS 03/22/2006  . STRABISMUS 02/25/2002    Past Surgical History:  Procedure Laterality Date  . WISDOM TOOTH EXTRACTION      OB History    Gravida Para Term Preterm AB Living   2 1 1     1    SAB TAB Ectopic Multiple Live Births           1       Home Medications    Prior to Admission medications   Medication Sig Start Date End Date Taking? Authorizing Provider  ALPRAZolam Duanne Moron) 1 MG tablet Take 1 mg 2 (two) times daily as needed by mouth for anxiety.  07/24/17   [provider]  ibuprofen (ADVIL,MOTRIN) 600 MG tablet Take 1 tablet (600 mg total) every 8 (eight) hours as needed by mouth. 07/28/17   Priscilla Schmidt, MD  penicillin v potassium (VEETID) 500 MG tablet Take 1 tablet (500 mg total) 3 (three) times daily by mouth. 07/28/17   Priscilla Schmidt, MD    Family History Family History  Problem Relation Age of Onset  . Asthma Sister   . Diabetes Maternal Grandmother   . COPD Neg Hx   .  Hypertension Neg Hx   . Stroke Neg Hx     Social History Social History   Tobacco Use  . Smoking status: Former Smoker    Years: 1.00    Types: Cigarettes  . Smokeless tobacco: Never Used  . Tobacco comment: quit this past wk  Substance Use Topics  . Alcohol use: No  . Drug use: No     Allergies   Chocolate   Review of Systems Review of Systems  Constitutional: Negative for appetite change, chills and fever.  HENT: Positive for dental problem and ear pain. Negative for drooling, facial swelling, nosebleeds, postnasal drip, rhinorrhea and trouble swallowing.   Eyes: Negative for pain and redness.  Respiratory: Negative for cough and wheezing.   Cardiovascular: Negative for chest pain.  Gastrointestinal: Negative for abdominal pain, nausea and vomiting.  Musculoskeletal: Negative for neck pain and neck stiffness.  Skin: Negative for color change and rash.  Neurological: Negative for weakness, light-headedness and headaches.  All other systems reviewed and are negative.    Physical Exam Updated Vital Signs BP 128/80 (BP Location: Right Arm)   Pulse 92   Temp 99.4 F (37.4 C) (Oral)   Resp 16   Ht 5\' 1"  (1.549 m)   Wt 76.2 kg (168  lb)   LMP 12/18/2016   SpO2 100%   BMI 31.74 kg/m   Physical Exam  Constitutional: She appears well-developed and well-nourished.  HENT:  Head: Normocephalic.  Right Ear: Tympanic membrane, external ear and ear canal normal.  Left Ear: Tympanic membrane, external ear and ear canal normal.  Nose: Nose normal. Right sinus exhibits no maxillary sinus tenderness and no frontal sinus tenderness. Left sinus exhibits no maxillary sinus tenderness and no frontal sinus tenderness.  Mouth/Throat: Uvula is midline, oropharynx is clear and moist and mucous membranes are normal. No oral lesions. Abnormal dentition. Dental caries present. No uvula swelling or lacerations. No oropharyngeal exudate, posterior oropharyngeal edema, posterior  oropharyngeal erythema or tonsillar abscesses.  Right lower molar with TTP, no gingival erythema or swelling No gross abscess No fluctuance or induration to the buccal mucosa or floor of the mouth  Eyes: Conjunctivae are normal. Right eye exhibits no discharge. Left eye exhibits no discharge.  Neck: Normal range of motion. Neck supple.  No stridor Handling secretions without difficulty No nuchal rigidity  Cardiovascular: Normal rate, regular rhythm and normal heart sounds.  Pulmonary/Chest: Effort normal. No respiratory distress.  Equal chest rise  Abdominal: She exhibits no distension.  Lymphadenopathy:       Head (right side): Submandibular adenopathy present. No submental and no tonsillar adenopathy present.       Head (left side): No submental, no submandibular and no tonsillar adenopathy present.    She has no cervical adenopathy.  Neurological: She is alert.  Skin: Skin is warm and dry.  Psychiatric:  Pt tearful  Nursing note and vitals reviewed.    ED Treatments / Results   Procedures Dental Block Date/Time: 07/28/2017 10:47 PM Performed by: Abigail Butts, PA-C Authorized by: Abigail Butts, PA-C   Consent:    Consent obtained:  Verbal   Consent given by:  Patient   Risks discussed:  Allergic reaction, infection, intravascular injection and pain   Alternatives discussed:  No treatment Indications:    Indications: dental pain   Location:    Block type:  Supraperiosteal   Supraperiosteal location:  Lower teeth   Lower teeth location:  31/RL 2nd molar Procedure details (see MAR for exact dosages):    Syringe type:  Luer lock syringe   Needle gauge:  27 G   Anesthetic injected:  Bupivacaine 0.5% WITH epi   Injection procedure:  Anatomic landmarks identified, introduced needle, negative aspiration for blood and incremental injection Post-procedure details:    Outcome:  Anesthesia achieved   Patient tolerance of procedure:  Tolerated well, no immediate  complications   (including critical care time)  Medications Ordered in ED Medications  acetaminophen (TYLENOL) tablet 1,000 mg (not administered)  bupivacaine-epinephrine (MARCAINE W/ EPI) 0.5% -1:200000 injection 1.8 mL (1.8 mLs Infiltration Given by Other 07/28/17 2241)     Initial Impression / Assessment and Plan / ED Course  I have reviewed the triage vital signs and the nursing notes.  Pertinent labs & imaging results that were available during my care of the patient were reviewed by me and considered in my medical decision making (see chart for details).     Patient with toothache.  No gross abscess.  Exam unconcerning for Ludwig's angina or spread of infection.  Will treat with penicillin prescribed last night and anti-inflammatory medicine.  Urged patient to follow-up with dentist.     Final Clinical Impressions(s) / ED Diagnoses   Final diagnoses:  Pain, dental  Dental caries    ED  Discharge Orders    None       Thanos Cousineau, Gwenlyn Perking 07/28/17 2248    Dorie Rank, MD 07/28/17 [phone]

## 2017-07-28 NOTE — Discharge Instructions (Signed)
1. Medications: alternate ibuprofen and tylenol, penicillin (prescribed this morning), usual home medications 2. Treatment: rest, drink plenty of fluids, take medications as prescribed 3. Follow Up: Please followup with dentistry within 1 week for discussion of your diagnoses and further evaluation after today's visit; if you do not have a primary care doctor use the resource guide provided to find one; Return to the ER for high fevers, difficulty breathing, difficulty swallowing or other concerning symptoms

## 2017-07-28 NOTE — ED Notes (Addendum)
Pt outside room tearful that tooth hurts.  Explained message has been sent to pharmacy requesting medication

## 2017-07-28 NOTE — ED Triage Notes (Signed)
Patient presented to ed with c/o right lower jaw dental pain. Patient rating her pain 10/10. Patient state she is working on making appointment to get it pull out.

## 2017-07-28 NOTE — ED Notes (Signed)
Pt stable, ambulatory, states understanding of discharge instructions 

## 2017-12-17 ENCOUNTER — Encounter (HOSPITAL_COMMUNITY): Payer: Self-pay

## 2018-06-02 ENCOUNTER — Encounter (HOSPITAL_COMMUNITY): Payer: Self-pay | Admitting: *Deleted

## 2018-06-02 ENCOUNTER — Other Ambulatory Visit: Payer: Self-pay

## 2018-06-02 ENCOUNTER — Inpatient Hospital Stay (HOSPITAL_COMMUNITY)
Admission: AD | Admit: 2018-06-02 | Discharge: 2018-06-02 | Disposition: A | Discharge status: Home / Self Care | Payer: Medicaid Other | Source: Ambulatory Visit | Attending: Obstetrics and Gynecology | Admitting: Obstetrics and Gynecology

## 2018-06-02 DIAGNOSIS — B9689 Other specified bacterial agents as the cause of diseases classified elsewhere: Secondary | ICD-10-CM | POA: Diagnosis not present

## 2018-06-02 DIAGNOSIS — N760 Acute vaginitis: Secondary | ICD-10-CM | POA: Diagnosis not present

## 2018-06-02 DIAGNOSIS — F17210 Nicotine dependence, cigarettes, uncomplicated: Secondary | ICD-10-CM | POA: Diagnosis not present

## 2018-06-02 DIAGNOSIS — R1030 Lower abdominal pain, unspecified: Secondary | ICD-10-CM | POA: Diagnosis present

## 2018-06-02 DIAGNOSIS — R112 Nausea with vomiting, unspecified: Secondary | ICD-10-CM | POA: Insufficient documentation

## 2018-06-02 LAB — URINALYSIS, ROUTINE W REFLEX MICROSCOPIC
Bilirubin Urine: NEGATIVE
Glucose, UA: NEGATIVE mg/dL
Hgb urine dipstick: NEGATIVE
KETONES UR: NEGATIVE mg/dL
Nitrite: NEGATIVE
PH: 7 (ref 5.0–8.0)
PROTEIN: NEGATIVE mg/dL
Specific Gravity, Urine: 1.027 (ref 1.005–1.030)

## 2018-06-02 LAB — POCT PREGNANCY, URINE: Preg Test, Ur: NEGATIVE

## 2018-06-02 LAB — WET PREP, GENITAL
Sperm: NONE SEEN
TRICH WET PREP: NONE SEEN
YEAST WET PREP: NONE SEEN

## 2018-06-02 MED ORDER — METRONIDAZOLE 500 MG PO TABS: 500.0000 mg | ORAL_TABLET | Freq: Two times a day (BID) | ORAL | 0 refills | Status: AC

## 2018-06-02 NOTE — Progress Notes (Signed)
Patient discharged home, but left prior to obtaining discharge VS or signing AVS.  CNM discussed discharge with patient, but patient was not in room when RN came in to go over instructions and get signature.

## 2018-06-02 NOTE — Discharge Instructions (Signed)
In late February 2020, the [facility] will be moving to the [employer] At that time, the MAU (Maternity Admissions Unit), where you are being seen today, will no longer take care of non-pregnant patients. We strongly encourage you to find a doctor's office before that time, so that you can be seen with any GYN concerns, like vaginal discharge, urinary tract infection, etc.. in a timely manner.  In order to make an office visit more convenient, the Center for [place] [facility] will be offering evening hours with same-day appointments, walk-in appointments and scheduled appointments available during this time.  [facility] Hours: Monday - 8am - 7:30 pm with walk-in between 4pm- 7:30 pm Tuesday - 8 am - 5 pm open late and accepting walk-ins from 4pm - 7:30pm Wednesday - 8 am - 5 pm open late and accepting walk-ins from 4pm - 7:30pm Thursday 8 am - 5 pm Friday 8 am - 5 pm  For an appointment please call the Center for [place] [facility] at [phone]  For urgent needs, [facility] is also available for management of urgent GYN complaints such as vaginal discharge or urinary tract infections.

## 2018-06-02 NOTE — MAU Note (Addendum)
Stomach has been hurting, going on for a couple months, really gotten bad the last month.really really hurts when she is on her period.  Been throwing up for the past couple weeks, once this morning.

## 2018-06-02 NOTE — MAU Provider Note (Signed)
History     CSN: [id number]  Arrival date and time: 06/02/18 1801   First Provider Initiated Contact with Patient 06/02/18 1928      Chief Complaint  Patient presents with  . Abdominal Pain  . Emesis   HPI  Ms.  Priscilla [name] is a 22 y.o. year old [id number] non-pregnant female who presents to MAU reporting lower abdominal pain for a couple of months that is worsening over the last month. She reports the pain is worse when she is on her period. She also reports N/V x 2 wks; one episode this AM. She describes the pain as randomly sharp. She has tried taking aleve without relief.   Past Medical History:  Diagnosis Date  . Headache   . Infection    UTI, was dx yesterday    Past Surgical History:  Procedure Laterality Date  . WISDOM TOOTH EXTRACTION      Family History  Problem Relation Age of Onset  . Asthma Sister   . Diabetes Maternal Grandmother   . COPD Neg Hx   . Hypertension Neg Hx   . Stroke Neg Hx     Social History   Tobacco Use  . Smoking status: Current Every Day Smoker    Packs/day: 0.25    Years: 1.00    Pack years: 0.25    Types: Cigarettes  . Smokeless tobacco: Never Used  . Tobacco comment: quit this past wk  Substance Use Topics  . Alcohol use: Yes    Comment: occasionally  . Drug use: Yes    Types: Marijuana    Comment: last used 06-01-18    Allergies:  Allergies  Allergen Reactions  . Chocolate Other (See Comments)    Reaction:  Migraines    Medications Prior to Admission  Medication Sig Dispense Refill Last Dose  . ALPRAZolam (XANAX) 1 MG tablet Take 1 mg 2 (two) times daily as needed by mouth for anxiety.   0 07/28/2017 at Unknown time  . ibuprofen (ADVIL,MOTRIN) 600 MG tablet Take 1 tablet (600 mg total) every 8 (eight) hours as needed by mouth. 15 tablet 0   . penicillin v potassium (VEETID) 500 MG tablet Take 1 tablet (500 mg total) 3 (three) times daily by mouth. 21 tablet 0     Review of Systems  Constitutional: Negative.    HENT: Negative.   Eyes: Negative.   Respiratory: Negative.   Cardiovascular: Negative.   Gastrointestinal: Positive for abdominal pain (lower), nausea and vomiting.  Endocrine: Negative.   Genitourinary: Negative.   Musculoskeletal: Negative.   Skin: Negative.   Allergic/Immunologic: Negative.   Neurological: Negative.   Hematological: Negative.   Psychiatric/Behavioral: Negative.    Physical Exam   Blood pressure 119/66, pulse 100, temperature 99.2 F (37.3 C), resp. rate 16, weight 71.4 kg, last menstrual period 05/16/2018, SpO2 99 %.  Physical Exam  Nursing note and vitals reviewed. Constitutional: She is oriented to person, place, and time. She appears well-developed and well-nourished.  HENT:  Head: Normocephalic and atraumatic.  Eyes: Pupils are equal, round, and reactive to light.  Neck: Normal range of motion.  Cardiovascular: Normal rate.  Respiratory: Effort normal.  GI: Soft.  Genitourinary:  Genitourinary Comments: Uterus: non-tender, SE: cervix is smooth, pink, no lesions, small amt of white vaginal d/c -- WP, GC/CT done, closed/long/firm, no CMT or friability, no adnexal tenderness   Musculoskeletal: Normal range of motion.  Neurological: She is alert and oriented to person, place, and time.  Skin: Skin  is warm and dry.  Psychiatric: She has a normal mood and affect. Her behavior is normal. Judgment and thought content normal.    MAU Course  Procedures  MDM CCUA UPT Wet Prep  GC/CT -- pending UCx -- pending  Results for orders placed or performed during the hospital encounter of 06/02/18 (from the past 24 hour(s))  Urinalysis, Routine w reflex microscopic     Status: Abnormal   Collection Time: 06/02/18  6:59 PM  Result Value Ref Range   Color, Urine YELLOW YELLOW   APPearance HAZY (A) CLEAR   Specific Gravity, Urine 1.027 1.005 - 1.030   pH 7.0 5.0 - 8.0   Glucose, UA NEGATIVE NEGATIVE mg/dL   Hgb urine dipstick NEGATIVE NEGATIVE   Bilirubin  Urine NEGATIVE NEGATIVE   Ketones, ur NEGATIVE NEGATIVE mg/dL   Protein, ur NEGATIVE NEGATIVE mg/dL   Nitrite NEGATIVE NEGATIVE   Leukocytes, UA TRACE (A) NEGATIVE   RBC / HPF 0-5 0 - 5 RBC/hpf   WBC, UA 11-20 0 - 5 WBC/hpf   Bacteria, UA RARE (A) NONE SEEN   Squamous Epithelial / LPF 0-5 0 - 5   Mucus PRESENT   Pregnancy, urine POC     Status: None   Collection Time: 06/02/18  7:01 PM  Result Value Ref Range   Preg Test, Ur NEGATIVE NEGATIVE  Wet prep, genital     Status: Abnormal   Collection Time: 06/02/18  7:28 PM  Result Value Ref Range   Yeast Wet Prep HPF POC NONE SEEN NONE SEEN   Trich, Wet Prep NONE SEEN NONE SEEN   Clue Cells Wet Prep HPF POC PRESENT (A) NONE SEEN   WBC, Wet Prep HPF POC FEW (A) NONE SEEN   Sperm NONE SEEN     Assessment and Plan  Bacterial vaginitis - Plan: Discharge patient - Rx for Flagyl 500 mg BID x 7 days - Information provided on BV - Late 2020 MAU GYN changes info provided - Advised to F/U with [facility] for GYN needs - Patient verbalized an understanding of the plan of care and agrees.    Laury Deep, MSN, CNM 06/02/2018, 7:29 PM

## 2018-06-03 LAB — GC/CHLAMYDIA PROBE AMP (~~LOC~~) NOT AT ARMC
CHLAMYDIA, DNA PROBE: NEGATIVE
NEISSERIA GONORRHEA: NEGATIVE

## 2018-06-04 LAB — URINE CULTURE: SPECIAL REQUESTS: NORMAL

## 2018-08-27 ENCOUNTER — Inpatient Hospital Stay (HOSPITAL_COMMUNITY)
Admission: AD | Admit: 2018-08-27 | Discharge: 2018-08-27 | Disposition: A | Discharge status: Home / Self Care | Payer: Medicaid Other | Attending: Obstetrics & Gynecology | Admitting: Obstetrics & Gynecology

## 2018-08-27 ENCOUNTER — Encounter (HOSPITAL_COMMUNITY): Payer: Self-pay

## 2018-08-27 ENCOUNTER — Other Ambulatory Visit: Payer: Self-pay

## 2018-08-27 DIAGNOSIS — A084 Viral intestinal infection, unspecified: Secondary | ICD-10-CM | POA: Diagnosis not present

## 2018-08-27 DIAGNOSIS — Z3202 Encounter for pregnancy test, result negative: Secondary | ICD-10-CM | POA: Insufficient documentation

## 2018-08-27 DIAGNOSIS — E860 Dehydration: Secondary | ICD-10-CM | POA: Insufficient documentation

## 2018-08-27 DIAGNOSIS — F17210 Nicotine dependence, cigarettes, uncomplicated: Secondary | ICD-10-CM | POA: Insufficient documentation

## 2018-08-27 DIAGNOSIS — R112 Nausea with vomiting, unspecified: Secondary | ICD-10-CM | POA: Diagnosis present

## 2018-08-27 LAB — POCT PREGNANCY, URINE: PREG TEST UR: NEGATIVE

## 2018-08-27 LAB — COMPREHENSIVE METABOLIC PANEL
ALBUMIN: 3.9 g/dL (ref 3.5–5.0)
ALT: 11 U/L (ref 0–44)
AST: 15 U/L (ref 15–41)
Alkaline Phosphatase: 51 U/L (ref 38–126)
Anion gap: 9 (ref 5–15)
BUN: 10 mg/dL (ref 6–20)
CALCIUM: 8 mg/dL — AB (ref 8.9–10.3)
CO2: 22 mmol/L (ref 22–32)
Chloride: 102 mmol/L (ref 98–111)
Creatinine, Ser: 0.72 mg/dL (ref 0.44–1.00)
GFR calc non Af Amer: >60 mL/min (ref >60)
GLUCOSE: 98 mg/dL (ref 70–99)
POTASSIUM: 3.7 mmol/L (ref 3.5–5.1)
Sodium: 133 mmol/L — ABNORMAL LOW (ref 135–145)
Total Bilirubin: 1.1 mg/dL (ref 0.3–1.2)
Total Protein: 6.9 g/dL (ref 6.5–8.1)

## 2018-08-27 LAB — AMYLASE: Amylase: 109 U/L — ABNORMAL HIGH (ref 28–100)

## 2018-08-27 LAB — CBC
HEMATOCRIT: 40 % (ref 36.0–46.0)
HEMOGLOBIN: 13.3 g/dL (ref 12.0–15.0)
MCH: 27.1 pg (ref 26.0–34.0)
MCHC: 33.3 g/dL (ref 30.0–36.0)
MCV: 81.6 fL (ref 80.0–100.0)
Platelets: 246 10*3/uL (ref 150–400)
RBC: 4.9 MIL/uL (ref 3.87–5.11)
RDW: 14.5 % (ref 11.5–15.5)
WBC: 11.7 10*3/uL — AB (ref 4.0–10.5)
nRBC: 0 % (ref 0.0–0.2)

## 2018-08-27 LAB — URINALYSIS, ROUTINE W REFLEX MICROSCOPIC
Bacteria, UA: NONE SEEN
Bilirubin Urine: NEGATIVE
GLUCOSE, UA: NEGATIVE mg/dL
KETONES UR: 5 mg/dL — AB
LEUKOCYTES UA: NEGATIVE
NITRITE: NEGATIVE
PH: 5 (ref 5.0–8.0)
Protein, ur: NEGATIVE mg/dL
Specific Gravity, Urine: 1.021 (ref 1.005–1.030)

## 2018-08-27 LAB — WET PREP, GENITAL
SPERM: NONE SEEN
TRICH WET PREP: NONE SEEN
Yeast Wet Prep HPF POC: NONE SEEN

## 2018-08-27 LAB — LIPASE, BLOOD: Lipase: 23 U/L (ref 11–51)

## 2018-08-27 MED ORDER — ONDANSETRON 4 MG PO TBDP: 4.0000 mg | ORAL_TABLET | Freq: Three times a day (TID) | ORAL | 0 refills | Status: DC | PRN

## 2018-08-27 MED ORDER — SODIUM CHLORIDE 0.9 % IV BOLUS: 1000.0000 mL | Freq: Once | INTRAVENOUS | Status: DC

## 2018-08-27 MED ORDER — ACETAMINOPHEN 500 MG PO TABS
1000.0000 mg | ORAL_TABLET | Freq: Four times a day (QID) | ORAL | Status: DC | PRN
  Administered 2018-08-27: 1000 mg via ORAL
  Filled 2018-08-27: qty 2

## 2018-08-27 MED ORDER — SODIUM CHLORIDE 0.9 % IV SOLN
8.0000 mg | Freq: Once | INTRAVENOUS | Status: DC
  Filled 2018-08-27: qty 4

## 2018-08-27 MED ORDER — ONDANSETRON 4 MG PO TBDP
4.0000 mg | ORAL_TABLET | Freq: Three times a day (TID) | ORAL | Status: DC | PRN
  Administered 2018-08-27: 4 mg via ORAL
  Filled 2018-08-27: qty 1

## 2018-08-27 NOTE — MAU Note (Signed)
Presents with c/o abdominal pain, H/A, and N&V.  Reports symptoms began approx 1 week.  Reports she's approximately 2 weeks pregnanct, hasn't taken a HPT.  LMP 08/06/2018

## 2018-08-27 NOTE — Discharge Instructions (Signed)

## 2018-08-27 NOTE — MAU Provider Note (Signed)
History     CSN: [id number]  Arrival date and time: 08/27/18 1006   First Provider Initiated Contact with Patient 08/27/18 1104      Chief Complaint  Patient presents with  . Emesis  . Headache  . Abdominal Pain   22 y.o. Non-pregnant female presenting with N/V, abd pain, and HA. Abd pain started 1 week ago. Located bilateral lower abdomen. Describes as intermittent and cramping, rates 8/10. Has not taken anything today for the pain. N/V started today. Had emesis multiple times. Cannot tolerate food or fluids. Reports eating a dip that was left out overnight. No diarrhea. No fevers. No sick contacts. Denies urinary sx.   Past Medical History:  Diagnosis Date  . Headache   . Infection    UTI, was dx yesterday    Past Surgical History:  Procedure Laterality Date  . WISDOM TOOTH EXTRACTION      Family History  Problem Relation Age of Onset  . Asthma Sister   . Diabetes Maternal Grandmother   . COPD Neg Hx   . Hypertension Neg Hx   . Stroke Neg Hx     Social History   Tobacco Use  . Smoking status: Current Every Day Smoker    Packs/day: 0.25    Years: 1.00    Pack years: 0.25    Types: Cigarettes  . Smokeless tobacco: Never Used  . Tobacco comment: quit this past wk  Substance Use Topics  . Alcohol use: Yes    Comment: occasionally  . Drug use: Yes    Types: Marijuana    Comment: last used 06-01-18    Allergies:  Allergies  Allergen Reactions  . Chocolate Other (See Comments)    Reaction:  Migraines    No medications prior to admission.    Review of Systems  Constitutional: Negative for chills and fever.  HENT: Negative for congestion.   Respiratory: Negative for cough.   Gastrointestinal: Positive for abdominal pain, nausea and vomiting. Negative for constipation and diarrhea.  Genitourinary: Negative for dysuria, frequency, hematuria, pelvic pain, urgency, vaginal bleeding and vaginal discharge.  Neurological: Positive for headaches.   Physical  Exam   Blood pressure 124/71, pulse 90, temperature 99 F (37.2 C), temperature source Oral, resp. rate 20, height 5\' 1"  (1.549 m), weight 74.4 kg, last menstrual period 08/06/2018, SpO2 100 %, unknown if currently breastfeeding.  Physical Exam  Constitutional: She is oriented to person, place, and time. She appears well-developed and well-nourished. No distress.  HENT:  Head: Normocephalic and atraumatic.  Neck: Normal range of motion.  Respiratory: Effort normal. No respiratory distress.  GI: Soft. She exhibits no distension and no mass. There is tenderness in the epigastric area. There is no rebound and no guarding.  Musculoskeletal: Normal range of motion.  Neurological: She is alert and oriented to person, place, and time.  Skin: Skin is warm and dry.  Psychiatric: She has a normal mood and affect.   Results for orders placed or performed during the hospital encounter of 08/27/18 (from the past 24 hour(s))  Urinalysis, Routine w reflex microscopic     Status: Abnormal   Collection Time: 08/27/18 10:51 AM  Result Value Ref Range   Color, Urine YELLOW YELLOW   APPearance CLEAR CLEAR   Specific Gravity, Urine 1.021 1.005 - 1.030   pH 5.0 5.0 - 8.0   Glucose, UA NEGATIVE NEGATIVE mg/dL   Hgb urine dipstick SMALL (A) NEGATIVE   Bilirubin Urine NEGATIVE NEGATIVE   Ketones, ur 5 (  A) NEGATIVE mg/dL   Protein, ur NEGATIVE NEGATIVE mg/dL   Nitrite NEGATIVE NEGATIVE   Leukocytes, UA NEGATIVE NEGATIVE   RBC / HPF 0-5 0 - 5 RBC/hpf   WBC, UA 0-5 0 - 5 WBC/hpf   Bacteria, UA NONE SEEN NONE SEEN   Squamous Epithelial / LPF 0-5 0 - 5   Mucus PRESENT   Pregnancy, urine POC     Status: None   Collection Time: 08/27/18 10:56 AM  Result Value Ref Range   Preg Test, Ur NEGATIVE NEGATIVE  Wet prep, genital     Status: Abnormal   Collection Time: 08/27/18 12:09 PM  Result Value Ref Range   Yeast Wet Prep HPF POC NONE SEEN NONE SEEN   Trich, Wet Prep NONE SEEN NONE SEEN   Clue Cells Wet  Prep HPF POC PRESENT (A) NONE SEEN   WBC, Wet Prep HPF POC MANY (A) NONE SEEN   Sperm NONE SEEN   Comprehensive metabolic panel     Status: Abnormal   Collection Time: 08/27/18 12:20 PM  Result Value Ref Range   Sodium 133 (L) 135 - 145 mmol/L   Potassium 3.7 3.5 - 5.1 mmol/L   Chloride 102 98 - 111 mmol/L   CO2 22 22 - 32 mmol/L   Glucose, Bld 98 70 - 99 mg/dL   BUN 10 6 - 20 mg/dL   Creatinine, Ser 0.72 0.44 - 1.00 mg/dL   Calcium 8.0 (L) 8.9 - 10.3 mg/dL   Total Protein 6.9 6.5 - 8.1 g/dL   Albumin 3.9 3.5 - 5.0 g/dL   AST 15 15 - 41 U/L   ALT 11 0 - 44 U/L   Alkaline Phosphatase 51 38 - 126 U/L   Total Bilirubin 1.1 0.3 - 1.2 mg/dL   GFR calc non Af Amer >60 >60 mL/min   GFR calc Af Amer >60 >60 mL/min   Anion gap 9 5 - 15  CBC     Status: Abnormal   Collection Time: 08/27/18 12:20 PM  Result Value Ref Range   WBC 11.7 (H) 4.0 - 10.5 K/uL   RBC 4.90 3.87 - 5.11 MIL/uL   Hemoglobin 13.3 12.0 - 15.0 g/dL   HCT 40.0 36.0 - 46.0 %   MCV 81.6 80.0 - 100.0 fL   MCH 27.1 26.0 - 34.0 pg   MCHC 33.3 30.0 - 36.0 g/dL   RDW 14.5 11.5 - 15.5 %   Platelets 246 150 - 400 K/uL   nRBC 0.0 0.0 - 0.2 %  Lipase, blood     Status: None   Collection Time: 08/27/18 12:20 PM  Result Value Ref Range   Lipase 23 11 - 51 U/L  Amylase     Status: Abnormal   Collection Time: 08/27/18 12:20 PM  Result Value Ref Range   Amylase 109 (H) 28 - 100 U/L   MAU Course  Procedures NS Zofran  MDM Labs ordered and reviewed. No evidence of pregnancy or acute abdominal process. Tolerating po. No emesis. HA likely secondary to dehydration. N/V likely viral vs food poisioning. Stable for discharge home.   Assessment and Plan   1. Viral gastroenteritis   2. Dehydration   3. Pregnancy test negative    Discharge home Follow up with PCP as needed Rx Zofran Hydrate  Allergies as of 08/27/2018      Reactions   Chocolate Other (See Comments)   Reaction:  Migraines      Medication List     TAKE  these medications   ALPRAZolam 1 MG tablet Commonly known as:  XANAX Take 1 mg 2 (two) times daily as needed by mouth for anxiety.   ibuprofen 600 MG tablet Commonly known as:  ADVIL,MOTRIN Take 1 tablet (600 mg total) every 8 (eight) hours as needed by mouth.   ondansetron 4 MG disintegrating tablet Commonly known as:  ZOFRAN-ODT Take 1 tablet (4 mg total) by mouth every 8 (eight) hours as needed for nausea or vomiting.      Julianne Handler, CNM 08/27/2018, 1:31 PM

## 2018-08-28 LAB — GC/CHLAMYDIA PROBE AMP (~~LOC~~) NOT AT ARMC
Chlamydia: NEGATIVE
Neisseria Gonorrhea: NEGATIVE

## 2018-09-17 NOTE — L&D Delivery Note (Signed)
Operative Delivery Note Pt progressed to complete and started pushing.  After 2 pushes there was a prolonged FHR decel to the sixties for about 5 minutes that eventually resolved with position change.  Mushroom cup applied and with pulls with one ctx with vacuum in green zone, no pop-offs, At 12:20 AM a viable female was delivered via Vaginal, Vacuum Neurosurgeon).  Presentation: vertex; Position: Right,, Occiput,, Anterior; Station: +2.  NICU team at delivery but dismissed when baby was vigorous.  Verbal consent: obtained from patient.  Risks and benefits discussed in detail.  Risks include, but are not limited to the risks of anesthesia, bleeding, infection, damage to maternal tissues, fetal cephalhematoma.  There is also the risk of inability to effect vaginal delivery of the head, or shoulder dystocia that cannot be resolved by established maneuvers, leading to the need for emergency cesarean section.  APGAR: 8, 9; weight pending.   Placenta status: spontaneous, intact.   Cord:  with the following complications: none.    Anesthesia:  Epidural Instruments: mushroom cup Episiotomy: None Lacerations: 1st degree Suture Repair: 3.0 vicryl rapide Est. Blood Loss (mL):  100  Mom to postpartum.  Baby to Couplet care / Skin to Skin.  Blane Ohara Carlen Fils 05/22/2019, 12:43 AM

## 2018-10-15 ENCOUNTER — Encounter (HOSPITAL_COMMUNITY): Payer: Self-pay

## 2018-10-15 ENCOUNTER — Ambulatory Visit (HOSPITAL_COMMUNITY)
Admission: EM | Admit: 2018-10-15 | Discharge: 2018-10-15 | Disposition: A | Discharge status: Home / Self Care | Payer: Medicaid Other | Attending: Emergency Medicine | Admitting: Emergency Medicine

## 2018-10-15 DIAGNOSIS — R112 Nausea with vomiting, unspecified: Secondary | ICD-10-CM | POA: Diagnosis not present

## 2018-10-15 DIAGNOSIS — N390 Urinary tract infection, site not specified: Secondary | ICD-10-CM | POA: Insufficient documentation

## 2018-10-15 DIAGNOSIS — Z3201 Encounter for pregnancy test, result positive: Secondary | ICD-10-CM | POA: Insufficient documentation

## 2018-10-15 LAB — POCT URINALYSIS DIP (DEVICE)
Bilirubin Urine: NEGATIVE
Glucose, UA: NEGATIVE mg/dL
Ketones, ur: >=160 mg/dL — AB
Nitrite: NEGATIVE
Protein, ur: NEGATIVE mg/dL
Specific Gravity, Urine: 1.015 (ref 1.005–1.030)
Urobilinogen, UA: 0.2 mg/dL (ref 0.0–1.0)
pH: 7 (ref 5.0–8.0)

## 2018-10-15 LAB — POCT PREGNANCY, URINE: Preg Test, Ur: POSITIVE — AB

## 2018-10-15 MED ORDER — CEPHALEXIN 500 MG PO CAPS: 500.0000 mg | ORAL_CAPSULE | Freq: Four times a day (QID) | ORAL | 0 refills | Status: DC

## 2018-10-15 MED ORDER — ONDANSETRON HCL 4 MG PO TABS: 4.0000 mg | ORAL_TABLET | Freq: Four times a day (QID) | ORAL | 0 refills | Status: DC

## 2018-10-15 NOTE — ED Provider Notes (Signed)
[facility]    CSN: [id number] Arrival date & time: 10/15/18  7425     History   Chief Complaint Chief Complaint  Patient presents with  . Emesis    HPI Priscilla [name] is a 23 y.o. female.   C/o emesis for 2 weeks ,denies any pain, no vaginal bleeding, LMP 12/15 and thinks that she is possibly pregnant.      Past Medical History:  Diagnosis Date  . Headache   . Infection    UTI, was dx yesterday    Patient Active Problem List   Diagnosis Date Noted  . Bacterial vaginitis 06/02/2018  . Normal delivery 12/22/2012  . ALLERGIC RHINITIS 03/22/2006  . STRABISMUS 02/25/2002    Past Surgical History:  Procedure Laterality Date  . WISDOM TOOTH EXTRACTION      OB History    Gravida  2   Para  1   Term  1   Preterm      AB  1   Living  1     SAB  1   TAB      Ectopic      Multiple      Live Births  1            Home Medications    Prior to Admission medications   Medication Sig Start Date End Date Taking? Authorizing Provider  ALPRAZolam Duanne Moron) 1 MG tablet Take 1 mg 2 (two) times daily as needed by mouth for anxiety.  07/24/17   [provider]  cephALEXin (KEFLEX) 500 MG capsule Take 1 capsule (500 mg total) by mouth 4 (four) times daily. 10/15/18   Marney Setting, NP  ibuprofen (ADVIL,MOTRIN) 600 MG tablet Take 1 tablet (600 mg total) every 8 (eight) hours as needed by mouth. 07/28/17   Jola Schmidt, MD  ondansetron (ZOFRAN) 4 MG tablet Take 1 tablet (4 mg total) by mouth every 6 (six) hours. 10/15/18   Marney Setting, NP  ondansetron (ZOFRAN-ODT) 4 MG disintegrating tablet Take 1 tablet (4 mg total) by mouth every 8 (eight) hours as needed for nausea or vomiting. 08/27/18   Julianne Handler, CNM    Family History Family History  Problem Relation Age of Onset  . Asthma Sister   . Diabetes Maternal Grandmother   . COPD Neg Hx   . Hypertension Neg Hx   . Stroke Neg Hx     Social History Social History     Tobacco Use  . Smoking status: Current Every Day Smoker    Packs/day: 0.25    Years: 1.00    Pack years: 0.25    Types: Cigarettes  . Smokeless tobacco: Never Used  . Tobacco comment: quit this past wk  Substance Use Topics  . Alcohol use: Yes    Comment: occasionally  . Drug use: Yes    Types: Marijuana    Comment: last used 06-01-18     Allergies   Chocolate   Review of Systems Review of Systems  Constitutional: Negative.   Respiratory: Negative.   Cardiovascular: Negative.   Gastrointestinal: Positive for nausea and vomiting.  Endocrine: Negative.   Genitourinary: Negative.   Neurological: Negative.      Physical Exam Triage Vital Signs ED Triage Vitals  Enc Vitals Group     BP 10/15/18 1000 123/70     Pulse Rate 10/15/18 1000 87     Resp 10/15/18 1000 20     Temp 10/15/18 1000 98.7 F (37.1 C)  Temp Source 10/15/18 0959 Oral     SpO2 10/15/18 1000 96 %     Weight --      Height --      Head Circumference --      Peak Flow --      Pain Score 10/15/18 1001 0     Pain Loc --      Pain Edu? --      Excl. in [place]? --    No data found.  Updated Vital Signs BP 123/70 (BP Location: Right Arm)   Pulse 87   Temp 98.7 F (37.1 C) (Oral)   Resp 20   LMP 08/31/2018   SpO2 96%   Visual Acuity Right Eye Distance:   Left Eye Distance:   Bilateral Distance:    Right Eye Near:   Left Eye Near:    Bilateral Near:     Physical Exam Cardiovascular:     Rate and Rhythm: Normal rate.  Pulmonary:     Effort: Pulmonary effort is normal.  Abdominal:     General: Bowel sounds are normal.     Palpations: Abdomen is soft.  Skin:    General: Skin is warm.  Neurological:     General: No focal deficit present.     Mental Status: She is alert.      UC Treatments / Results  Labs (all labs ordered are listed, but only abnormal results are displayed) Labs Reviewed  POCT URINALYSIS DIP (DEVICE) - Abnormal; Notable for the following components:       Result Value   Ketones, ur >=160 (*)    Hgb urine dipstick TRACE (*)    Leukocytes, UA MODERATE (*)    All other components within normal limits  POCT PREGNANCY, URINE - Abnormal; Notable for the following components:   Preg Test, Ur POSITIVE (*)    All other components within normal limits  POC URINE PREG, ED    EKG None  Radiology No results found.  Procedures Procedures (including critical care time)  Medications Ordered in UC Medications - No data to display  Initial Impression / Assessment and Plan / UC Course  I have reviewed the triage vital signs and the nursing notes.  Pertinent labs & imaging results that were available during my care of the patient were reviewed by me and considered in my medical decision making (see chart for details).    Your preg test was positive it also shows an infection in your urine.  You will need to call an obgyn to follow up to determine how many weeks you are. Take nausea meds as needed.  Eat with antibiotics    Final Clinical Impressions(s) / UC Diagnoses   Final diagnoses:  Non-intractable vomiting with nausea, unspecified vomiting type     Discharge Instructions     Your preg test was positive it also shows an infection in your urine.  You will need to call an obgyn to follow up to determine how many weeks you are. Take nausea meds as needed.  Eat with antibiotics     ED Prescriptions    Medication Sig Dispense Auth. Provider   ondansetron (ZOFRAN) 4 MG tablet Take 1 tablet (4 mg total) by mouth every 6 (six) hours. 12 tablet Morley Kos L, NP   cephALEXin (KEFLEX) 500 MG capsule Take 1 capsule (500 mg total) by mouth 4 (four) times daily. 20 capsule Marney Setting, NP     Controlled Substance Prescriptions  Controlled Substance Registry consulted?  Not Applicable   Marney Setting, NP 10/15/18 1022

## 2018-10-15 NOTE — Discharge Instructions (Addendum)
Your preg test was positive it also shows an infection in your urine.  You will need to call an obgyn to follow up to determine how many weeks you are. Take nausea meds as needed.  Eat with antibiotics

## 2018-10-15 NOTE — ED Triage Notes (Signed)
Pt presents with vomiting over past 2 weeks.

## 2018-10-17 ENCOUNTER — Encounter (HOSPITAL_COMMUNITY): Payer: Self-pay | Admitting: *Deleted

## 2018-10-17 ENCOUNTER — Inpatient Hospital Stay (HOSPITAL_COMMUNITY)
Admission: AD | Admit: 2018-10-17 | Discharge: 2018-10-18 | Disposition: A | Discharge status: Home / Self Care | Payer: Medicaid Other | Source: Ambulatory Visit | Attending: Family Medicine | Admitting: Family Medicine

## 2018-10-17 DIAGNOSIS — F17210 Nicotine dependence, cigarettes, uncomplicated: Secondary | ICD-10-CM | POA: Insufficient documentation

## 2018-10-17 DIAGNOSIS — O99331 Smoking (tobacco) complicating pregnancy, first trimester: Secondary | ICD-10-CM | POA: Insufficient documentation

## 2018-10-17 DIAGNOSIS — O219 Vomiting of pregnancy, unspecified: Secondary | ICD-10-CM | POA: Diagnosis not present

## 2018-10-17 DIAGNOSIS — Z3A01 Less than 8 weeks gestation of pregnancy: Secondary | ICD-10-CM | POA: Insufficient documentation

## 2018-10-17 DIAGNOSIS — O211 Hyperemesis gravidarum with metabolic disturbance: Secondary | ICD-10-CM | POA: Insufficient documentation

## 2018-10-17 DIAGNOSIS — E860 Dehydration: Secondary | ICD-10-CM | POA: Diagnosis not present

## 2018-10-17 DIAGNOSIS — O210 Mild hyperemesis gravidarum: Secondary | ICD-10-CM | POA: Diagnosis present

## 2018-10-17 LAB — URINALYSIS, ROUTINE W REFLEX MICROSCOPIC
Bilirubin Urine: NEGATIVE
Glucose, UA: NEGATIVE mg/dL
Hgb urine dipstick: NEGATIVE
Ketones, ur: >80 mg/dL — AB
Nitrite: NEGATIVE
PH: 6 (ref 5.0–8.0)
Protein, ur: NEGATIVE mg/dL
Specific Gravity, Urine: 1.025 (ref 1.005–1.030)

## 2018-10-17 LAB — URINALYSIS, MICROSCOPIC (REFLEX)

## 2018-10-17 MED ORDER — LACTATED RINGERS IV BOLUS
1000.0000 mL | Freq: Once | INTRAVENOUS | Status: AC
  Administered 2018-10-17: 1000 mL via INTRAVENOUS

## 2018-10-17 MED ORDER — ONDANSETRON HCL 4 MG/2ML IJ SOLN
4.0000 mg | Freq: Once | INTRAMUSCULAR | Status: AC
  Administered 2018-10-17: 4 mg via INTRAVENOUS
  Filled 2018-10-17: qty 2

## 2018-10-17 MED ORDER — PROMETHAZINE HCL 25 MG/ML IJ SOLN
12.5000 mg | Freq: Once | INTRAMUSCULAR | Status: AC
  Administered 2018-10-17: 12.5 mg via INTRAVENOUS
  Filled 2018-10-17: qty 1

## 2018-10-17 MED ORDER — M.V.I. ADULT IV INJ
Freq: Once | INTRAVENOUS | Status: AC
  Administered 2018-10-17: 22:00:00 via INTRAVENOUS
  Filled 2018-10-17: qty 1000

## 2018-10-17 MED ORDER — FAMOTIDINE IN NACL 20-0.9 MG/50ML-% IV SOLN
20.0000 mg | Freq: Once | INTRAVENOUS | Status: AC
  Administered 2018-10-17: 20 mg via INTRAVENOUS
  Filled 2018-10-17: qty 50

## 2018-10-17 NOTE — MAU Note (Signed)
I have not eaten in 3 days. I keep throwing up. Nothing stays down. Was seen [facility] 3days ago and given med for n/v and uti. Cannot get meds until Monday. Denies vag bleeding, d/c, or diarrhea

## 2018-10-17 NOTE — MAU Provider Note (Signed)
Chief Complaint: Emesis During Pregnancy   None     SUBJECTIVE HPI: Priscilla [name] is a 23 y.o. G3P1011 at [redacted]w[redacted]d by LMP who presents to maternity admissions reporting nausea and vomiting with emesis 5-6 times in 24 hours. She reports she cannot keep down any food or fluids. She was seen 1/29 in the ED and prescribed Zofran PO. She has not picked up this medication due to cost and is waiting for her dad to take her and pay for it. There are no other symptoms. She has not tried any treatments. She denies vaginal bleeding, vaginal itching/burning, urinary symptoms, h/a, dizziness, or fever/chills.     HPI  Past Medical History:  Diagnosis Date  . Headache   . Infection    UTI, was dx yesterday   Past Surgical History:  Procedure Laterality Date  . WISDOM TOOTH EXTRACTION     Social History   Socioeconomic History  . Marital status: Single    Spouse name: Not on file  . Number of children: Not on file  . Years of education: Not on file  . Highest education level: Not on file  Occupational History  . Not on file  Social Needs  . Financial resource strain: Not on file  . Food insecurity:    Worry: Not on file    Inability: Not on file  . Transportation needs:    Medical: Not on file    Non-medical: Not on file  Tobacco Use  . Smoking status: Current Every Day Smoker    Packs/day: 0.25    Years: 1.00    Pack years: 0.25    Types: Cigarettes  . Smokeless tobacco: Never Used  . Tobacco comment: quit this past wk  Substance and Sexual Activity  . Alcohol use: Yes    Comment: occasionally  . Drug use: Yes    Types: Marijuana    Comment: last used 06-01-18  . Sexual activity: Yes    Birth control/protection: None  Lifestyle  . Physical activity:    Days per week: Not on file    Minutes per session: Not on file  . Stress: Not on file  Relationships  . Social connections:    Talks on phone: Not on file    Gets together: Not on file    Attends religious service: Not  on file    Active member of club or organization: Not on file    Attends meetings of clubs or organizations: Not on file    Relationship status: Not on file  . Intimate partner violence:    Fear of current or ex partner: Not on file    Emotionally abused: Not on file    Physically abused: Not on file    Forced sexual activity: Not on file  Other Topics Concern  . Not on file  Social History Narrative  . Not on file   No current facility-administered medications on file prior to encounter.    Current Outpatient Medications on File Prior to Encounter  Medication Sig Dispense Refill  . ALPRAZolam (XANAX) 1 MG tablet Take 1 mg 2 (two) times daily as needed by mouth for anxiety.   0  . cephALEXin (KEFLEX) 500 MG capsule Take 1 capsule (500 mg total) by mouth 4 (four) times daily. 20 capsule 0  . ibuprofen (ADVIL,MOTRIN) 600 MG tablet Take 1 tablet (600 mg total) every 8 (eight) hours as needed by mouth. 15 tablet 0  . ondansetron (ZOFRAN) 4 MG tablet Take 1  tablet (4 mg total) by mouth every 6 (six) hours. 12 tablet 0  . ondansetron (ZOFRAN-ODT) 4 MG disintegrating tablet Take 1 tablet (4 mg total) by mouth every 8 (eight) hours as needed for nausea or vomiting. 20 tablet 0   Allergies  Allergen Reactions  . Chocolate Other (See Comments)    Reaction:  Migraines    ROS:  Review of Systems  Constitutional: Negative for chills, fatigue and fever.  Respiratory: Negative for shortness of breath.   Cardiovascular: Negative for chest pain.  Gastrointestinal: Positive for nausea and vomiting. Negative for abdominal pain.  Genitourinary: Negative for difficulty urinating, dysuria, flank pain, pelvic pain, vaginal bleeding, vaginal discharge and vaginal pain.  Neurological: Negative for dizziness and headaches.  Psychiatric/Behavioral: Negative.      I have reviewed patient's Past Medical Hx, Surgical Hx, Family Hx, Social Hx, medications and allergies.   Physical Exam   Patient  Vitals for the past 24 hrs:  BP Temp Pulse Resp Height Weight  10/18/18 0033 (!) 92/42 - - - - -  10/17/18 2030 129/68 - 86 - - -  10/17/18 2029 - 98.4 F (36.9 C) - 18 5\' 1"  (1.549 m) 72.6 kg   Constitutional: Well-developed, well-nourished female in moderate distress.  Cardiovascular: normal rate Respiratory: normal effort GI: Abd soft, non-tender. Pos BS x 4 MS: Extremities nontender, no edema, normal ROM Neurologic: Alert and oriented x 4.  GU: Neg CVAT.  PELVIC EXAM: Deferred    LAB RESULTS Results for orders placed or performed during the hospital encounter of 10/17/18 (from the past 24 hour(s))  Urinalysis, Routine w reflex microscopic     Status: Abnormal   Collection Time: 10/17/18  8:43 PM  Result Value Ref Range   Color, Urine YELLOW YELLOW   APPearance CLEAR CLEAR   Specific Gravity, Urine 1.025 1.005 - 1.030   pH 6.0 5.0 - 8.0   Glucose, UA NEGATIVE NEGATIVE mg/dL   Hgb urine dipstick NEGATIVE NEGATIVE   Bilirubin Urine NEGATIVE NEGATIVE   Ketones, ur >80 (A) NEGATIVE mg/dL   Protein, ur NEGATIVE NEGATIVE mg/dL   Nitrite NEGATIVE NEGATIVE   Leukocytes, UA TRACE (A) NEGATIVE  Urinalysis, Microscopic (reflex)     Status: Abnormal   Collection Time: 10/17/18  8:43 PM  Result Value Ref Range   RBC / HPF 0-5 0 - 5 RBC/hpf   WBC, UA 0-5 0 - 5 WBC/hpf   Bacteria, UA RARE (A) NONE SEEN   Squamous Epithelial / LPF 0-5 0 - 5       IMAGING No results found.  MAU Management/MDM: Orders Placed This Encounter  Procedures  . Urinalysis, Routine w reflex microscopic  . Urinalysis, Microscopic (reflex)  . Discharge patient    Meds ordered this encounter  Medications  . lactated ringers bolus 1,000 mL  . promethazine (PHENERGAN) injection 12.5 mg  . famotidine (PEPCID) IVPB 20 mg premix  . dextrose 5% lactated ringers 1,000 mL with multivitamins adult (INFUVITE ADULT) 10 mL infusion  . ondansetron (ZOFRAN) injection 4 mg  . lactated ringers bolus 1,000 mL  .  promethazine (PHENERGAN) 25 MG tablet    Sig: Take 0.5-1 tablets (12.5-25 mg total) by mouth every 6 (six) hours as needed for nausea.    Dispense:  30 tablet    Refill:  2    Order Specific Question:   Supervising Provider    Answer:   Donnamae Jude [[id number]]  . famotidine (PEPCID) 20 MG tablet    Sig:  Take 1 tablet (20 mg total) by mouth 2 (two) times daily.    Dispense:  60 tablet    Refill:  2    Order Specific Question:   Supervising Provider    Answer:   Donnamae Jude [[id number]]    IV fluids x 3 liters given with MVI in one.  Phenergan 12.5 mg IV push and Pepcid 20 mg IV given with no improvement. Discussed with the patient the risk of Zofran use in the first trimester of pregnancy. Risks of use in the first trimester include birth defects in babies, specifically cleft lip/palate.  Pt states understanding and plans to use Zofran sparingly.  Zofran 4 mg IV given in MAU. Pt reports she still feels nauseous but no evidence of vomiting while pt in MAU. She tolerated PO fluids.  D/C home with Rx for Phenergan sent to pharmacy. Pt given Rx for Keflex in ED on 1/29, likely making n/v worse.  No clear evidence of UTI and today's urine with no signs of infection.  D/C Keflex.  Use Zofran PRN also if desired.  F/U with prenatal care as planned, return to MAU as needed for emergencies.    Pt discharged with strict return precautions.  ASSESSMENT 1. Nausea and vomiting during pregnancy prior to [redacted] weeks gestation   2. Mild dehydration     PLAN Discharge home Allergies as of 10/18/2018      Reactions   Chocolate Other (See Comments)   Reaction:  Migraines      Medication List    STOP taking these medications   ALPRAZolam 1 MG tablet Commonly known as:  XANAX   cephALEXin 500 MG capsule Commonly known as:  KEFLEX   ibuprofen 600 MG tablet Commonly known as:  ADVIL,MOTRIN   ondansetron 4 MG disintegrating tablet Commonly known as:  ZOFRAN-ODT     TAKE these medications   famotidine 20  MG tablet Commonly known as:  PEPCID Take 1 tablet (20 mg total) by mouth 2 (two) times daily.   ondansetron 4 MG tablet Commonly known as:  ZOFRAN Take 1 tablet (4 mg total) by mouth every 6 (six) hours.   promethazine 25 MG tablet Commonly known as:  PHENERGAN Take 0.5-1 tablets (12.5-25 mg total) by mouth every 6 (six) hours as needed for nausea.      Follow-up [place] for [place] Follow up.   Specialty:  Obstetrics and Gynecology Why:  As scheduled. Return to MAU as needed for emergencies. Contact information: [place] Certified Nurse-Midwife 10/18/2018  12:45 AM

## 2018-10-18 MED ORDER — FAMOTIDINE 20 MG PO TABS: 20.0000 mg | ORAL_TABLET | Freq: Two times a day (BID) | ORAL | 2 refills | Status: DC

## 2018-10-18 MED ORDER — PROMETHAZINE HCL 25 MG PO TABS: 12.5000 mg | ORAL_TABLET | Freq: Four times a day (QID) | ORAL | 2 refills | Status: DC | PRN

## 2018-11-06 ENCOUNTER — Encounter (HOSPITAL_COMMUNITY): Payer: Self-pay | Admitting: *Deleted

## 2018-11-06 ENCOUNTER — Inpatient Hospital Stay (HOSPITAL_COMMUNITY)
Admission: AD | Admit: 2018-11-06 | Discharge: 2018-11-06 | Discharge status: Against Medical Advice | Payer: Medicaid Other | Attending: Obstetrics and Gynecology | Admitting: Obstetrics and Gynecology

## 2018-11-06 ENCOUNTER — Inpatient Hospital Stay (EMERGENCY_DEPARTMENT_HOSPITAL)
Admission: AD | Admit: 2018-11-06 | Discharge: 2018-11-06 | Disposition: A | Discharge status: Home / Self Care | Payer: Medicaid Other | Source: Home / Self Care | Attending: Obstetrics and Gynecology | Admitting: Obstetrics and Gynecology

## 2018-11-06 ENCOUNTER — Other Ambulatory Visit: Payer: Self-pay

## 2018-11-06 DIAGNOSIS — R112 Nausea with vomiting, unspecified: Secondary | ICD-10-CM | POA: Insufficient documentation

## 2018-11-06 DIAGNOSIS — F17210 Nicotine dependence, cigarettes, uncomplicated: Secondary | ICD-10-CM

## 2018-11-06 DIAGNOSIS — O210 Mild hyperemesis gravidarum: Secondary | ICD-10-CM | POA: Insufficient documentation

## 2018-11-06 DIAGNOSIS — Z3A09 9 weeks gestation of pregnancy: Secondary | ICD-10-CM

## 2018-11-06 DIAGNOSIS — O219 Vomiting of pregnancy, unspecified: Secondary | ICD-10-CM

## 2018-11-06 DIAGNOSIS — O99331 Smoking (tobacco) complicating pregnancy, first trimester: Secondary | ICD-10-CM | POA: Insufficient documentation

## 2018-11-06 DIAGNOSIS — R109 Unspecified abdominal pain: Secondary | ICD-10-CM | POA: Insufficient documentation

## 2018-11-06 DIAGNOSIS — O26891 Other specified pregnancy related conditions, first trimester: Secondary | ICD-10-CM | POA: Insufficient documentation

## 2018-11-06 DIAGNOSIS — Z5329 Procedure and treatment not carried out because of patient's decision for other reasons: Secondary | ICD-10-CM | POA: Diagnosis not present

## 2018-11-06 LAB — URINALYSIS, ROUTINE W REFLEX MICROSCOPIC
BILIRUBIN URINE: NEGATIVE
Bilirubin Urine: NEGATIVE
GLUCOSE, UA: NEGATIVE mg/dL
Glucose, UA: NEGATIVE mg/dL
HGB URINE DIPSTICK: NEGATIVE
Hgb urine dipstick: NEGATIVE
Ketones, ur: 15 mg/dL — AB
Ketones, ur: >80 mg/dL — AB
Leukocytes,Ua: NEGATIVE
Leukocytes,Ua: NEGATIVE
Nitrite: NEGATIVE
Nitrite: NEGATIVE
Protein, ur: NEGATIVE mg/dL
Protein, ur: NEGATIVE mg/dL
Specific Gravity, Urine: 1.015 (ref 1.005–1.030)
Specific Gravity, Urine: >1.03 — ABNORMAL HIGH (ref 1.005–1.030)
pH: 7 (ref 5.0–8.0)
pH: 6 (ref 5.0–8.0)

## 2018-11-06 MED ORDER — METOCLOPRAMIDE HCL 10 MG PO TABS: 10.0000 mg | ORAL_TABLET | Freq: Four times a day (QID) | ORAL | 2 refills | Status: DC

## 2018-11-06 MED ORDER — PANTOPRAZOLE SODIUM 40 MG IV SOLR
40.0000 mg | Freq: Once | INTRAVENOUS | Status: AC
  Administered 2018-11-06: 40 mg via INTRAVENOUS
  Filled 2018-11-06: qty 40

## 2018-11-06 MED ORDER — LACTATED RINGERS IV BOLUS
1000.0000 mL | Freq: Once | INTRAVENOUS | Status: AC
  Administered 2018-11-06: 1000 mL via INTRAVENOUS

## 2018-11-06 MED ORDER — THIAMINE HCL 100 MG/ML IJ SOLN
Freq: Once | INTRAVENOUS | Status: AC
  Administered 2018-11-06: 21:00:00 via INTRAVENOUS
  Filled 2018-11-06: qty 1000

## 2018-11-06 MED ORDER — METOCLOPRAMIDE HCL 5 MG/ML IJ SOLN
5.0000 mg | Freq: Once | INTRAMUSCULAR | Status: AC
  Administered 2018-11-06: 5 mg via INTRAVENOUS
  Filled 2018-11-06: qty 2

## 2018-11-06 NOTE — MAU Note (Signed)
Pt presents to MAU c/o vomiting and not being able to keep food or drink down. Pt reports she was here earlier but left because it was taking too long. Pt reports back saying the vomiting has not improved and she is having abdominal pain that is a 10/10 and feels super tight. Pt denies bleeding or LOF. No vaginal discharge. Pt reports a HA that is a 5/10.

## 2018-11-06 NOTE — MAU Note (Signed)
Pt reports she has been having abd pain x 3 days and N/V. Taking promethazine but all it does is make her sleep. Still feels nauseated

## 2018-11-06 NOTE — Discharge Instructions (Signed)
Morning Sickness  Morning sickness is when a woman feels nauseous during pregnancy. This nauseous feeling may or may not come with vomiting. It often occurs in the morning, but it can be a problem at any time of day. Morning sickness is most common during the first trimester. In some cases, it may continue throughout pregnancy. Although morning sickness is unpleasant, it is usually harmless unless the woman develops severe and continual vomiting (hyperemesis gravidarum), a condition that requires more intense treatment. What are the causes? The exact cause of this condition is not known, but it seems to be related to normal hormonal changes that occur in pregnancy. What increases the risk? You are more likely to develop this condition if:  You experienced nausea or vomiting before your pregnancy.  You had morning sickness during a previous pregnancy.  You are pregnant with more than one baby, such as twins. What are the signs or symptoms? Symptoms of this condition include:  Nausea.  Vomiting. How is this diagnosed? This condition is usually diagnosed based on your signs and symptoms. How is this treated? In many cases, treatment is not needed for this condition. Making some changes to what you eat may help to control symptoms. Your health care provider may also prescribe or recommend:  Vitamin B6 supplements.  Anti-nausea medicines.  Ginger. Follow these instructions at home: Medicines  Take over-the-counter and prescription medicines only as told by your health care provider. Do not use any prescription, over-the-counter, or herbal medicines for morning sickness without first talking with your health care provider.  Taking multivitamins before getting pregnant can prevent or decrease the severity of morning sickness in most women. Eating and drinking  Eat a piece of dry toast or crackers before getting out of bed in the morning.  Eat 5 or 6 small meals a day.  Eat dry and  bland foods, such as rice or a baked potato. Foods that are high in carbohydrates are often helpful.  Avoid greasy, fatty, and spicy foods.  Have someone cook for you if the smell of any food causes nausea and vomiting.  If you feel nauseous after taking prenatal vitamins, take the vitamins at night or with a snack.  Snack on protein foods between meals if you are hungry. Nuts, yogurt, and cheese are good options.  Drink fluids throughout the day.  Try ginger ale made with real ginger, ginger tea made from fresh grated ginger, or ginger candies. General instructions  Do not use any products that contain nicotine or tobacco, such as cigarettes and e-cigarettes. If you need help quitting, ask your health care provider.  Get an air purifier to keep the air in your house free of odors.  Get plenty of fresh air.  Try to avoid odors that trigger your nausea.  Consider trying these methods to help relieve symptoms: ? Wearing an acupressure wristband. These wristbands are often worn for seasickness. ? Acupuncture. Contact a health care provider if:  Your home remedies are not working and you need medicine.  You feel dizzy or light-headed.  You are losing weight. Get help right away if:  You have persistent and uncontrolled nausea and vomiting.  You faint.  You have severe pain in your abdomen. Summary  Morning sickness is when a woman feels nauseous during pregnancy. This nauseous feeling may or may not come with vomiting.  Morning sickness is most common during the first trimester.  It often occurs in the morning, but it can be a problem at  any time of day.  In many cases, treatment is not needed for this condition. Making some changes to what you eat may help to control symptoms. This information is not intended to replace advice given to you by your health care provider. Make sure you discuss any questions you have with your health care provider. Document Released:  10/25/2006 Document Revised: 10/06/2016 Document Reviewed: 10/06/2016 Elsevier Interactive Patient Education  2019 [place] Diet A bland diet consists of foods that are often soft and do not have a lot of fat, fiber, or extra seasonings. Foods without fat, fiber, or seasoning are easier for the body to digest. They are also less likely to irritate your mouth, throat, stomach, and other parts of your digestive system. A bland diet is sometimes called a BRAT diet. What is my plan? Your health care provider or food and nutrition specialist (dietitian) may recommend specific changes to your diet to prevent symptoms or to treat your symptoms. These changes may include:  Eating small meals often.  Cooking food until it is soft enough to chew easily.  Chewing your food well.  Drinking fluids slowly.  Not eating foods that are very spicy, sour, or fatty.  Not eating citrus fruits, such as oranges and grapefruit. What do I need to know about this diet?  Eat a variety of foods from the bland diet food list.  Do not follow a bland diet longer than needed.  Ask your health care provider whether you should take vitamins or supplements. What foods can I eat? Grains  Hot cereals, such as cream of wheat. Rice. Bread, crackers, or tortillas made from refined white flour. Vegetables Canned or cooked vegetables. Mashed or boiled potatoes. Fruits  Bananas. Applesauce. Other types of cooked or canned fruit with the skin and seeds removed, such as canned peaches or pears. Meats and other proteins  Scrambled eggs. Creamy peanut butter or other nut butters. Lean, well-cooked meats, such as chicken or fish. Tofu. Soups or broths. Dairy Low-fat dairy products, such as milk, cottage cheese, or yogurt. Beverages  Water. Herbal tea. Apple juice. Fats and oils Mild salad dressings. Canola or olive oil. Sweets and desserts Pudding. Custard. Fruit gelatin. Ice cream. The items listed above  may not be a complete list of recommended foods and beverages. Contact a dietitian for more options. What foods are not recommended? Grains Whole grain breads and cereals. Vegetables Raw vegetables. Fruits Raw fruits, especially citrus, berries, or dried fruits. Dairy Whole fat dairy foods. Beverages Caffeinated drinks. Alcohol. Seasonings and condiments Strongly flavored seasonings or condiments. Hot sauce. Salsa. Other foods Spicy foods. Fried foods. Sour foods, such as pickled or fermented foods. Foods with high sugar content. Foods high in fiber. The items listed above may not be a complete list of foods and beverages to avoid. Contact a dietitian for more information. Summary  A bland diet consists of foods that are often soft and do not have a lot of fat, fiber, or extra seasonings.  Foods without fat, fiber, or seasoning are easier for the body to digest.  Check with your health care provider to see how long you should follow this diet plan. It is not meant to be followed for long periods. This information is not intended to replace advice given to you by your health care provider. Make sure you discuss any questions you have with your health care provider. Document Released: 12/26/2015 Document Revised: 10/02/2017 Document Reviewed: 10/02/2017 Elsevier Interactive Patient Education  2019 Elsevier  Inc. ° °

## 2018-11-06 NOTE — MAU Provider Note (Addendum)
History     CSN: [id number]  Arrival date and time: 11/06/18 2694   First Provider Initiated Contact with Patient 11/06/18 1958      Chief Complaint  Patient presents with  . Emesis  . Abdominal Pain   Priscilla [name] is a 23 y.o. G3P1011 at [redacted]w[redacted]d who presents for Emesis and Abdominal Pain.  She states she has been having stomach pain for 2-3 days and has been intermittent and "tightness that I can feel and just hurts."  She states the pain is now constant and has been for  about an hour and a half.  She rates the pain a 10/10 and has not taken Tylenol today, but reports taking it yesterday without relief.  She also reports that she has been experiencing vomiting throughout the pregnancy and has been taking promethezine without relief. However, she took a dose a hour and a half ago.  She states the medicine just makes her "dose off," but she continues to throw up.  Patient states that she has not picked up her Pepcid or Zofran from the pharmacy. Patient states she has only had "2 nuggets" from [place] today, but was unable to keep them down.      OB History    Gravida  3   Para  1   Term  1   Preterm      AB  1   Living  1     SAB  1   TAB      Ectopic      Multiple      Live Births  1           Past Medical History:  Diagnosis Date  . Headache   . Infection    UTI, was dx yesterday    Past Surgical History:  Procedure Laterality Date  . WISDOM TOOTH EXTRACTION      Family History  Problem Relation Age of Onset  . Asthma Sister   . Diabetes Maternal Grandmother   . COPD Neg Hx   . Hypertension Neg Hx   . Stroke Neg Hx     Social History   Tobacco Use  . Smoking status: Current Every Day Smoker    Packs/day: 0.25    Years: 1.00    Pack years: 0.25    Types: Cigarettes  . Smokeless tobacco: Never Used  . Tobacco comment: only 1/3 of a cigarett a day  Substance Use Topics  . Alcohol use: Not Currently    Comment: occasionally  . Drug use:  Yes    Types: Marijuana    Comment: last used 11/05/18    Allergies:  Allergies  Allergen Reactions  . Chocolate Other (See Comments)    Reaction:  Migraines    Medications Prior to Admission  Medication Sig Dispense Refill Last Dose  . promethazine (PHENERGAN) 25 MG tablet Take 0.5-1 tablets (12.5-25 mg total) by mouth every 6 (six) hours as needed for nausea. 30 tablet 2 11/06/2018 at Unknown time  . famotidine (PEPCID) 20 MG tablet Take 1 tablet (20 mg total) by mouth 2 (two) times daily. 60 tablet 2   . ondansetron (ZOFRAN) 4 MG tablet Take 1 tablet (4 mg total) by mouth every 6 (six) hours. 12 tablet 0     Review of Systems  Constitutional: Negative for chills and fever.  Gastrointestinal: Positive for nausea and vomiting. Negative for abdominal pain, constipation and diarrhea.  Genitourinary: Negative for dyspareunia and vaginal discharge.  Neurological: Positive  for headaches (Since this morning 5/10.). Negative for dizziness and light-headedness.   Physical Exam   Blood pressure 129/68, pulse (!) 102, temperature 98.1 F (36.7 C), temperature source Oral, resp. rate 17, weight 75 kg, last menstrual period 08/31/2018, unknown if currently breastfeeding.  Physical Exam  Constitutional: She is oriented to person, place, and time. She appears well-developed and well-nourished. No distress.  HENT:  Head: Normocephalic and atraumatic.  Eyes: Conjunctivae are normal.  Neck: Normal range of motion.  Cardiovascular: Normal rate, regular rhythm and normal heart sounds.  Respiratory: Effort normal and breath sounds normal.  GI: Soft. Bowel sounds are normal. She exhibits no distension. There is abdominal tenderness in the periumbilical area. There is no rebound and no guarding.  Genitourinary:    Genitourinary Comments: Deferred   Musculoskeletal: Normal range of motion.  Neurological: She is alert and oriented to person, place, and time.  Skin: Skin is warm and dry.   Psychiatric: She has a normal mood and affect. Her behavior is normal.    MAU Course  Procedures Results for orders placed or performed during the hospital encounter of 11/06/18 (from the past 24 hour(s))  Urinalysis, Routine w reflex microscopic     Status: Abnormal   Collection Time: 11/06/18  7:46 PM  Result Value Ref Range   Color, Urine YELLOW YELLOW   APPearance CLEAR CLEAR   Specific Gravity, Urine >1.030 (H) 1.005 - 1.030   pH 6.0 5.0 - 8.0   Glucose, UA NEGATIVE NEGATIVE mg/dL   Hgb urine dipstick NEGATIVE NEGATIVE   Bilirubin Urine NEGATIVE NEGATIVE   Ketones, ur >80 (A) NEGATIVE mg/dL   Protein, ur NEGATIVE NEGATIVE mg/dL   Nitrite NEGATIVE NEGATIVE   Leukocytes,Ua NEGATIVE NEGATIVE     MDM PE Start IV Fluids via IV Antiemetic PPI  Assessment and Plan  23 year old G3P1011 at 9.4wks by LMP Nausea/Vomiting Abdominal Pain  -Exam findings discussed -Educated on importance of small frequent meals and compliance with antiemetics to help hinder N/V. -Start IV with LR bolus f/t Banana Bag -Give Reglan 5mg  now -Give Protonix 40mg  now -Will reassess and give oral pain medication once able to tolerate PO  Follow Up (9:45 PM)  -Patient sleeping, but easily awakened  -Patient reports improvement in symptoms and abdominal pain has subsided without pain medications. -Will allow for completion of banana bag and the give saltines/fluid for PO challenge. -If tolerated plan to discharge to home -Educated on [employer] with gradual introduction of other foods into diet. -Discussed usage of antiemetics ATC for the first 24 hours. -Rx for Reglan 10 mg PO Q 6 hrs prn, Disp 15 RF 2 sent to pharmacy on file.  -Encouraged to initiate [facility] -Encouraged to call or return to MAU if symptoms worsen or with the onset of new symptoms. -Bleeding Precautions -Nurse informed of POC and provider will place discharge order, but nurse to contact provider on call if change in status.     Maryann Conners MSN, CNM 11/06/2018, 7:58 PM

## 2018-12-10 LAB — OB RESULTS CONSOLE RUBELLA ANTIBODY, IGM: Rubella: IMMUNE

## 2018-12-10 LAB — OB RESULTS CONSOLE GC/CHLAMYDIA
Chlamydia: NEGATIVE
Gonorrhea: NEGATIVE

## 2018-12-10 LAB — OB RESULTS CONSOLE ABO/RH: RH Type: POSITIVE

## 2018-12-10 LAB — OB RESULTS CONSOLE HIV ANTIBODY (ROUTINE TESTING): HIV: NONREACTIVE

## 2018-12-10 LAB — OB RESULTS CONSOLE HEPATITIS B SURFACE ANTIGEN: Hepatitis B Surface Ag: NEGATIVE

## 2018-12-10 LAB — OB RESULTS CONSOLE RPR: RPR: NONREACTIVE

## 2018-12-10 LAB — OB RESULTS CONSOLE ANTIBODY SCREEN: Antibody Screen: NEGATIVE

## 2019-01-18 IMAGING — US US OB COMP LESS 14 WK
1 series · 15 of 28 positions shown · non-contrast
Comparison: None.

CLINICAL DATA: Abdominal pain and vaginal bleeding in first
trimester pregnancy.

EXAM:
OBSTETRIC <14 WK US AND TRANSVAGINAL OB US
TECHNIQUE: Both transabdominal and transvaginal ultrasound examinations were
performed for complete evaluation of the gestation as well as the
maternal uterus, adnexal regions, and pelvic cul-de-sac.
Transvaginal technique was performed to assess early pregnancy.

[Series 1: us ob comp less 14 wk · 78 acquisitions, 15 frames shown]
[im 1/78]
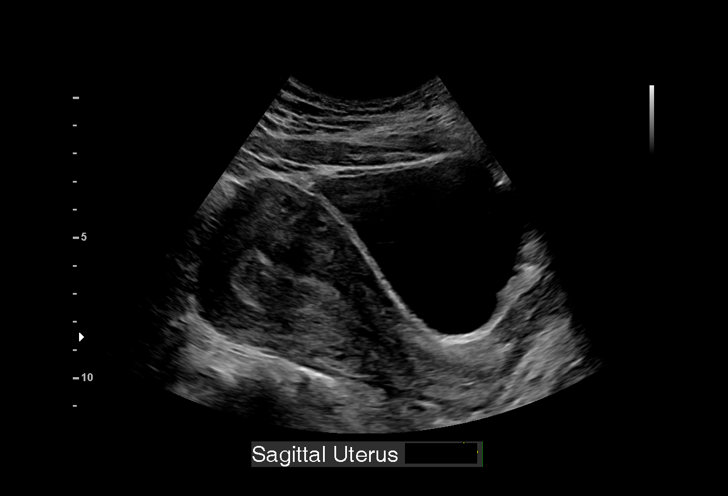
[im 6/78]
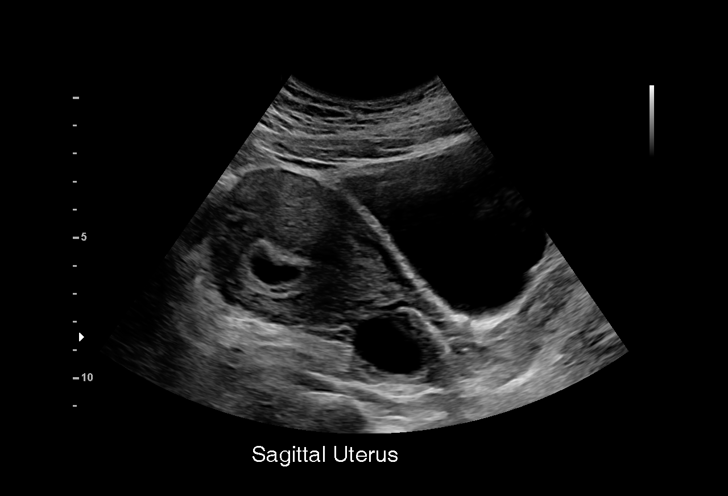
[im 12/78]
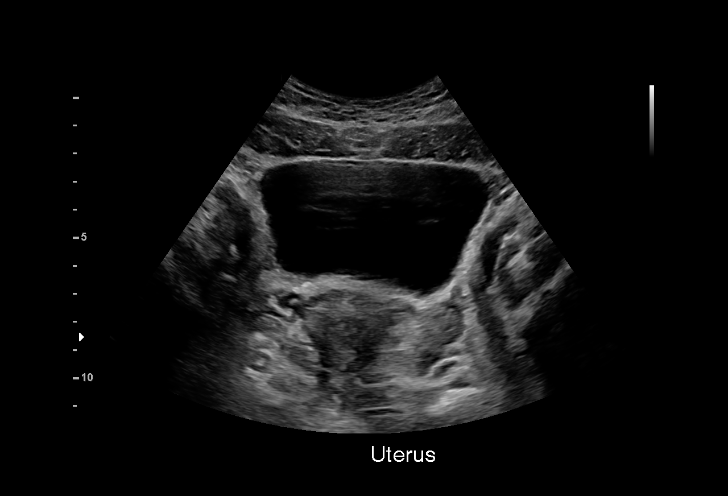
[im 18/78]
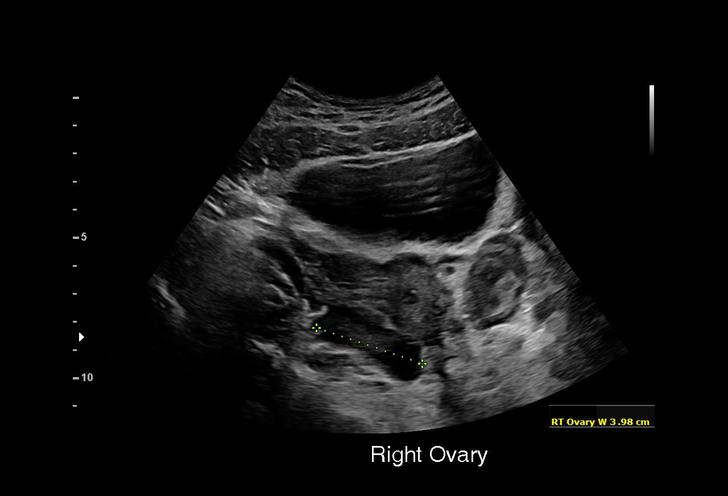
[im 23/78]
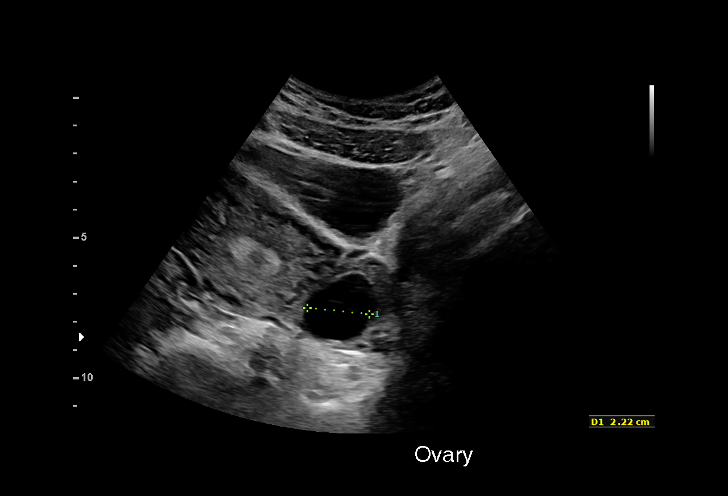
[im 29/78]
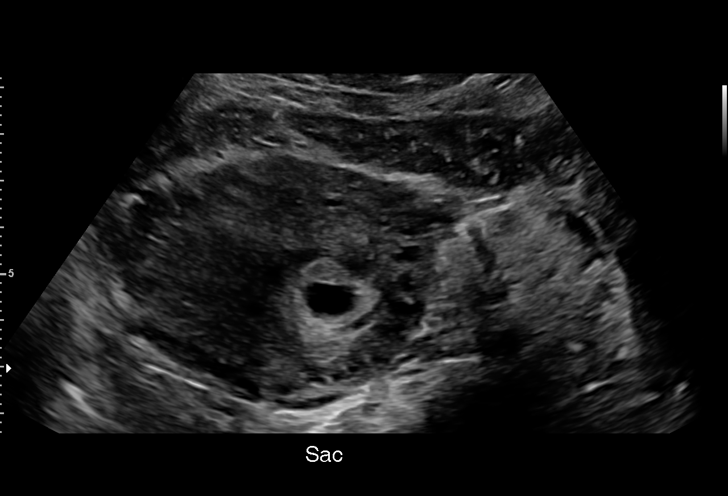
[im 35/78]
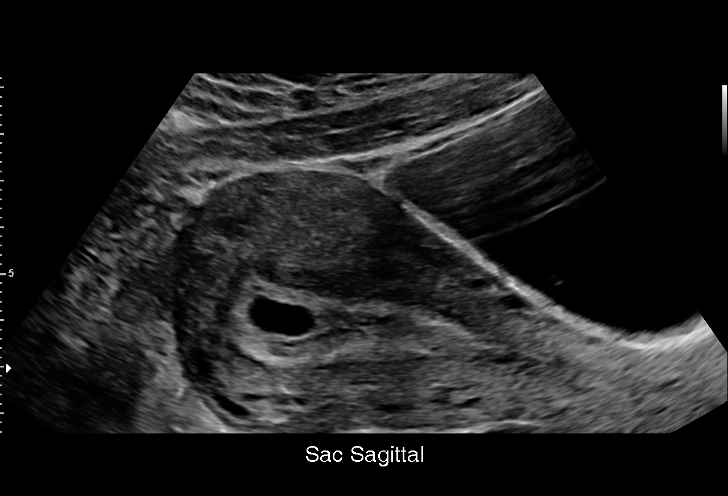
[im 40/78]
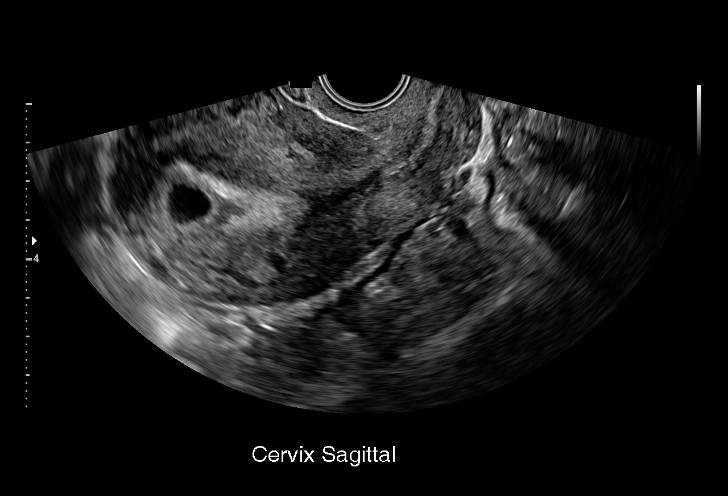
[im 43/78]
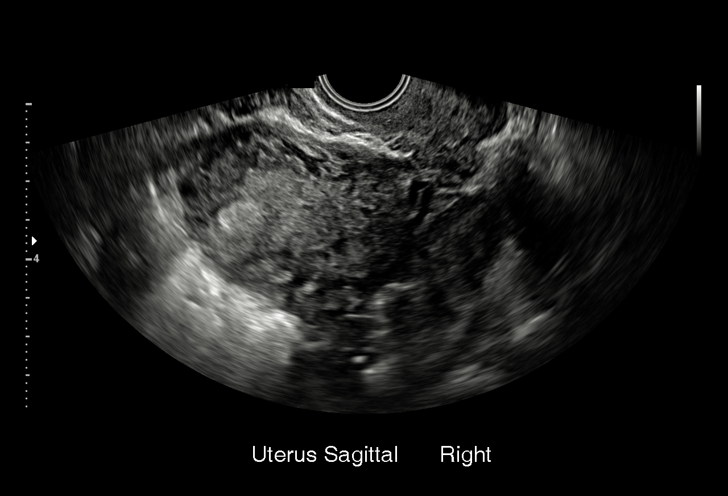
[im 49/78]
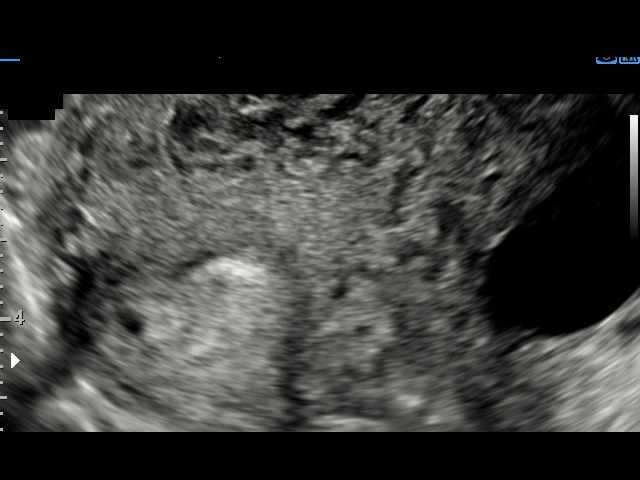
[im 55/78]
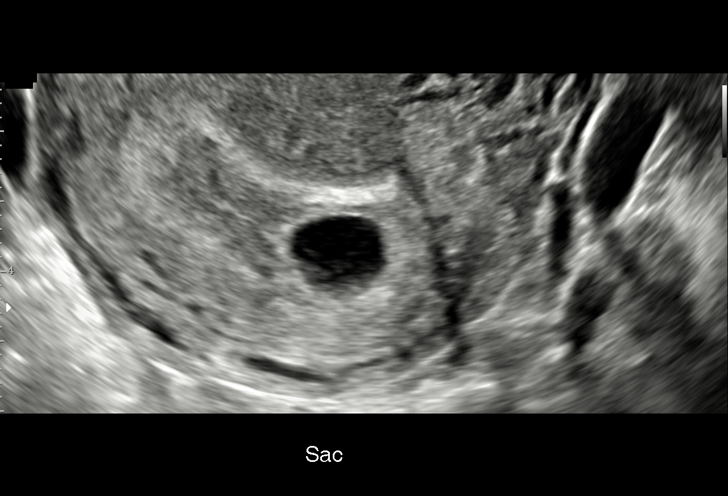
[im 60/78]
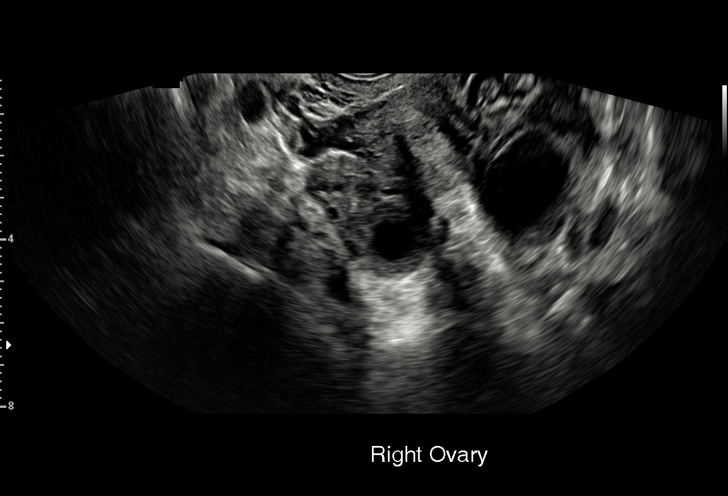
[im 66/78]
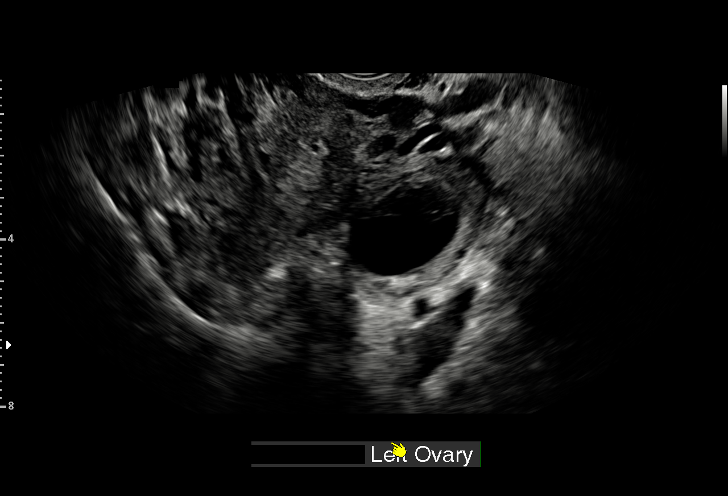
[im 72/78]
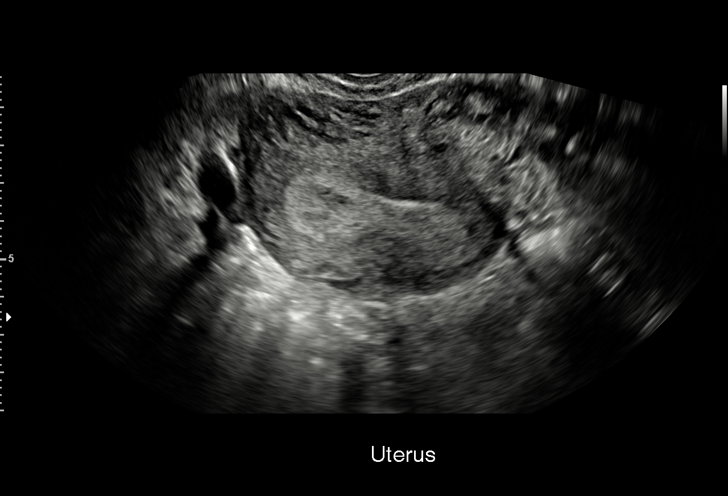
[im 78/78]
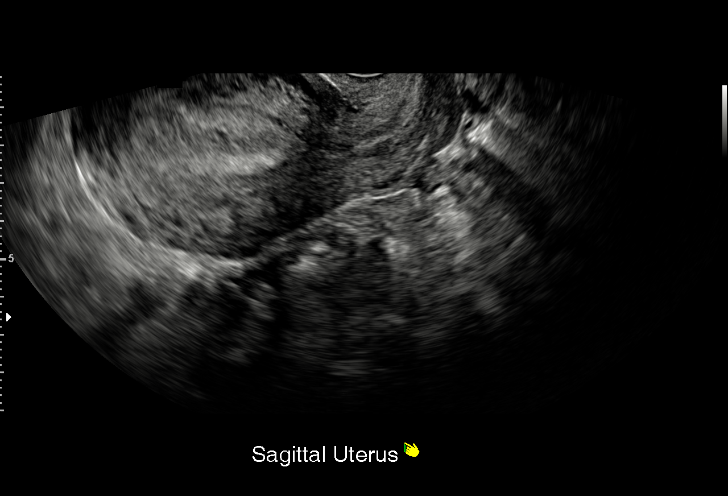

[15 of 28 positions shown; findings below may reference images not displayed]

FINDINGS: Intrauterine gestational sac: Single

Yolk sac:  Not Visualized.

Embryo:  Not Visualized.

MSD: 15  mm   6 w   2  d

Subchorionic hemorrhage:  None visualized.

Maternal uterus/adnexae: Small left ovarian corpus luteum noted.
Normal appearance of right ovary. No no mass or abnormal free fluid
identified.
IMPRESSION: Probable intrauterine gestational sac, but no yolk sac, fetal pole,
or cardiac activity yet visualized. Recommend follow-up quantitative
B-HCG levels and follow-up US in 14 days to assess viability. This
recommendation follows SRU consensus guidelines: Diagnostic Criteria
for Nonviable Pregnancy Early in the First Trimester. N Engl J Med

## 2019-01-25 IMAGING — US US OB TRANSVAGINAL
1 series · 15 of 28 positions shown · non-contrast
Comparison: 02/01/2017 .

CLINICAL DATA: Pregnancy.  Evaluate viability.

EXAM:
OBSTETRIC <14 WK US AND TRANSVAGINAL OB US
TECHNIQUE: Both transabdominal and transvaginal ultrasound examinations were
performed for complete evaluation of the gestation as well as the
maternal uterus, adnexal regions, and pelvic cul-de-sac.
Transvaginal technique was performed to assess early pregnancy.

[Series 1: us ob transvaginal · 15 of 47 slices shown]
[im 1/47]
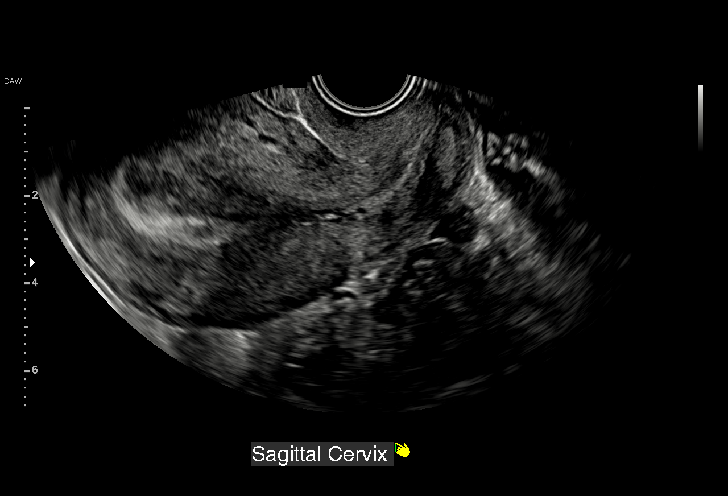
[im 4/47]
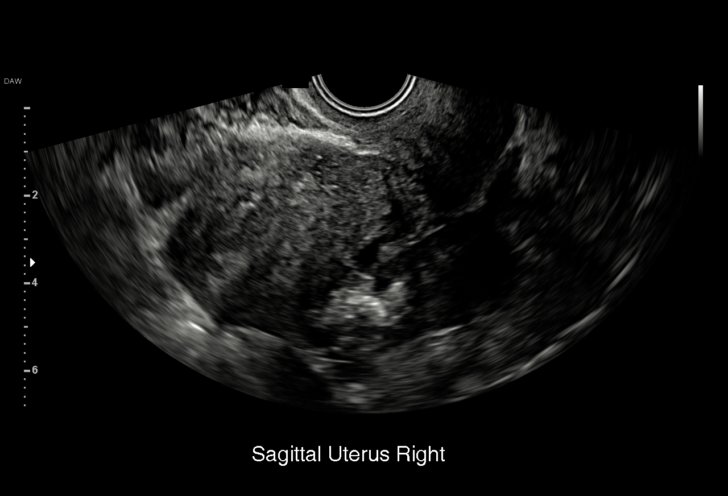
[im 7/47]
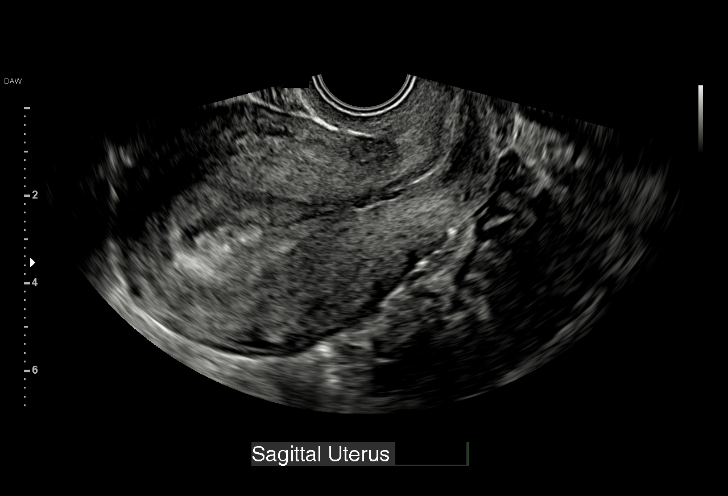
[im 11/47]
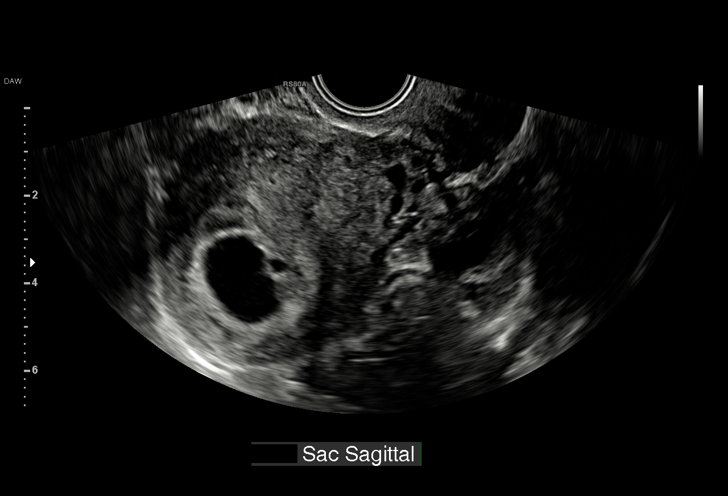
[im 14/47]
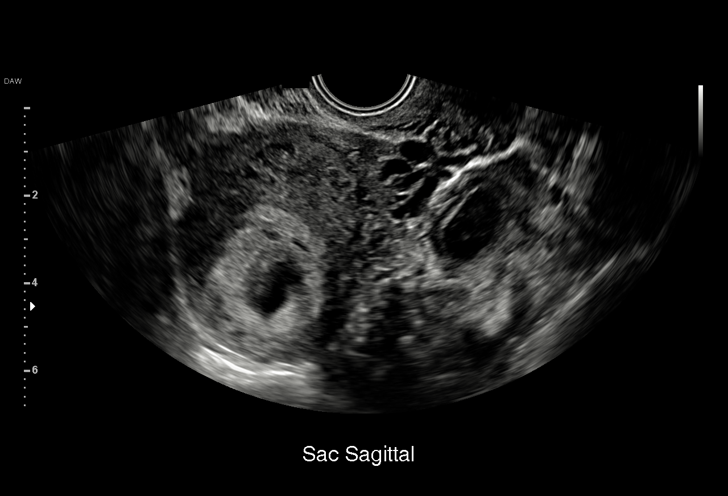
[im 18/47]
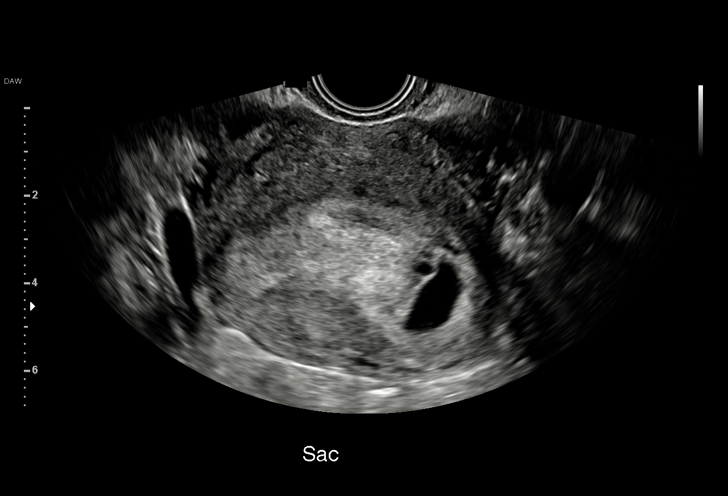
[im 21/47]
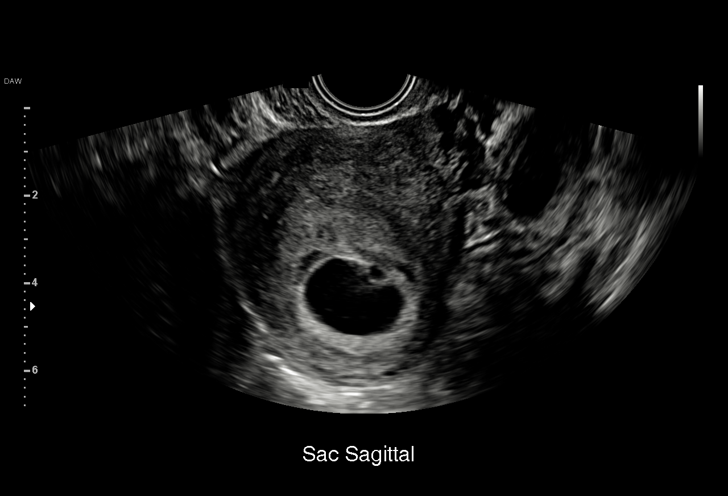
[im 24/47]
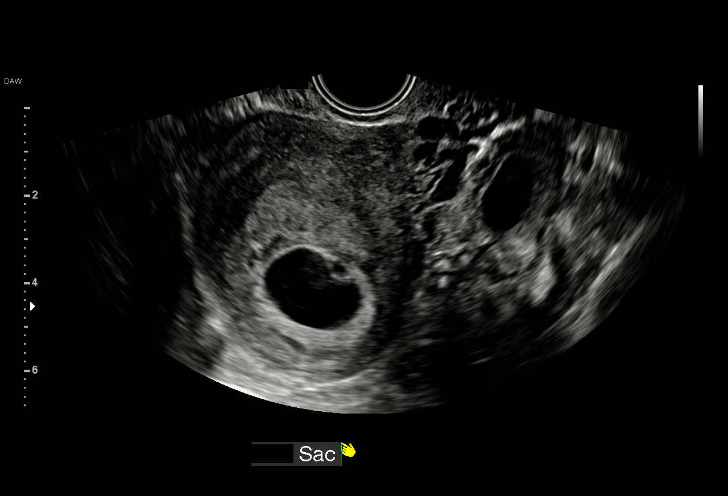
[im 26/47]
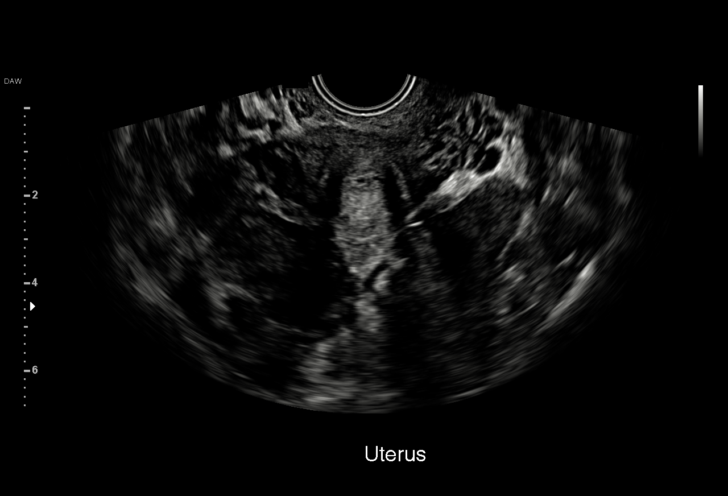
[im 29/47]
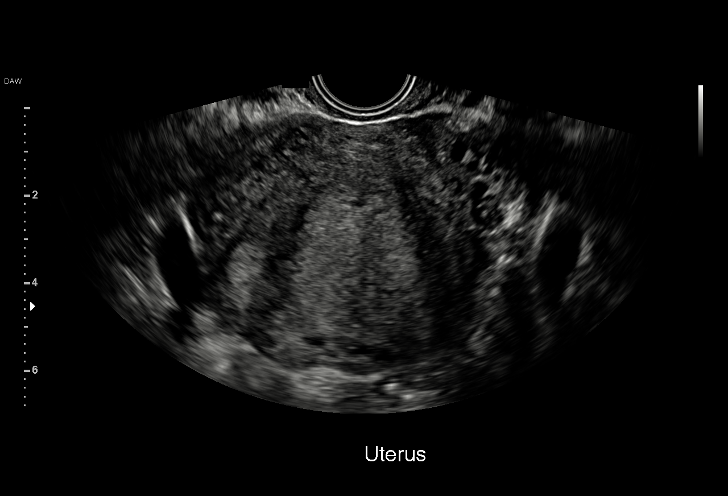
[im 33/47]
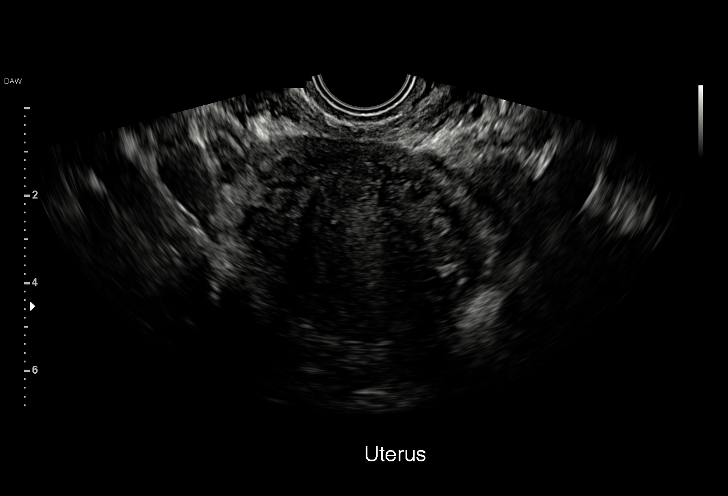
[im 36/47]
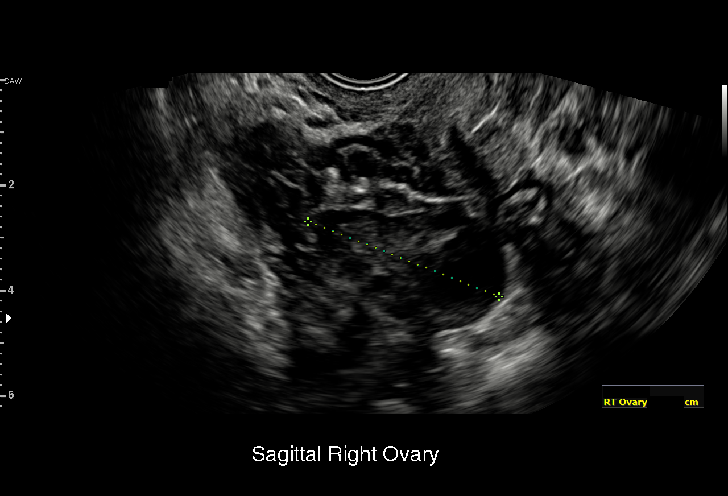
[im 40/47]
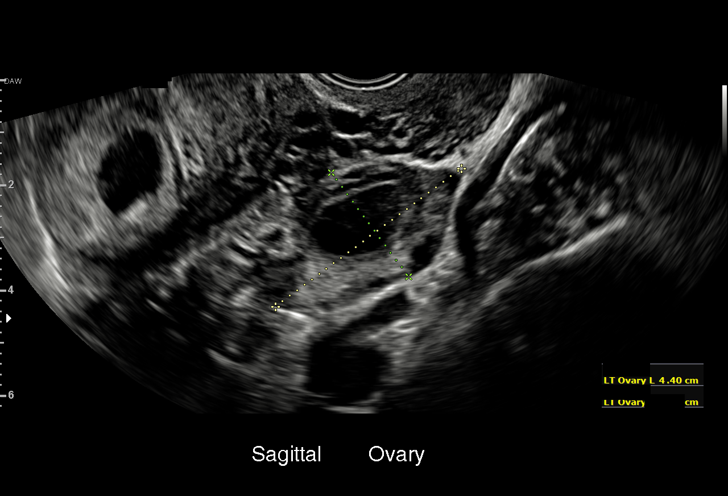
[im 43/47]
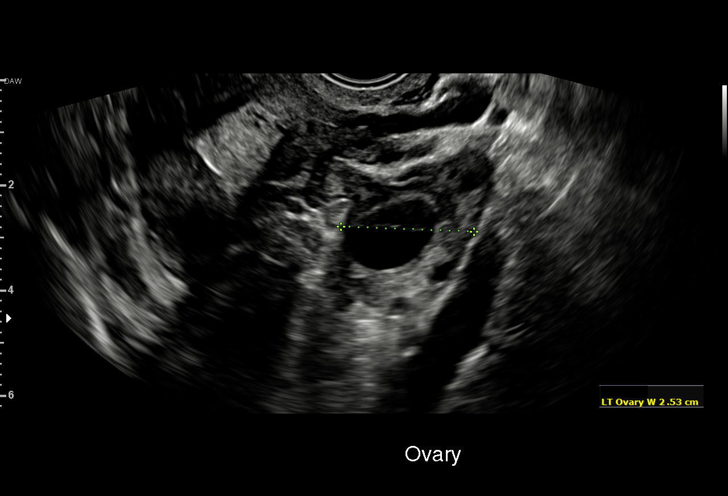
[im 47/47]
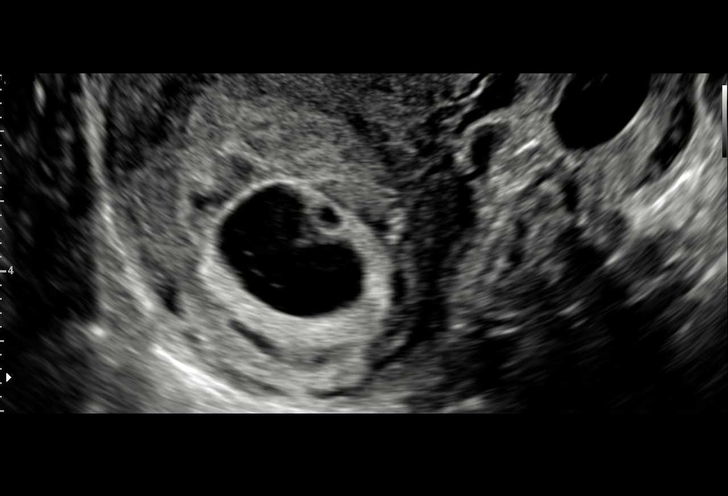

[15 of 28 positions shown; findings below may reference images not displayed]

FINDINGS: Intrauterine gestational sac: Single

Yolk sac:  Present

Embryo:  None visualized

Cardiac Activity: None visualized

MSD: 1.9  mm   6 w   5  d

Subchorionic hemorrhage:  Small subchorionic hemorrhage

Maternal uterus/adnexae: No focal abnormality otherwise noted. No
free fluid.
IMPRESSION: 1. Intrauterine sac and yolk sac noted on today's examination. No
fetal pole yet identified . Probable early intrauterine gestational
sac, but no fetal pole, or cardiac activity yet visualized.
Recommend follow-up quantitative B-HCG levels and follow-up US in 14
days to assess viability. This recommendation follows SRU consensus
guidelines: Diagnostic Criteria for Nonviable Pregnancy Early in the
First Trimester. N Engl J Med 5462; [DATE].

2.  Small subchorionic hemorrhage.  This should also be followed.

## 2019-02-17 ENCOUNTER — Inpatient Hospital Stay (HOSPITAL_COMMUNITY)
Admission: AD | Admit: 2019-02-17 | Discharge: 2019-02-17 | Disposition: A | Discharge status: Home / Self Care | Payer: Medicaid Other | Source: Ambulatory Visit | Attending: Emergency Medicine | Admitting: Emergency Medicine

## 2019-02-17 ENCOUNTER — Encounter (HOSPITAL_COMMUNITY): Payer: Self-pay | Admitting: *Deleted

## 2019-02-17 ENCOUNTER — Other Ambulatory Visit: Payer: Self-pay

## 2019-02-17 DIAGNOSIS — O9989 Other specified diseases and conditions complicating pregnancy, childbirth and the puerperium: Secondary | ICD-10-CM | POA: Insufficient documentation

## 2019-02-17 DIAGNOSIS — Z79899 Other long term (current) drug therapy: Secondary | ICD-10-CM | POA: Insufficient documentation

## 2019-02-17 DIAGNOSIS — Z3A24 24 weeks gestation of pregnancy: Secondary | ICD-10-CM | POA: Diagnosis not present

## 2019-02-17 DIAGNOSIS — F17210 Nicotine dependence, cigarettes, uncomplicated: Secondary | ICD-10-CM | POA: Diagnosis not present

## 2019-02-17 DIAGNOSIS — K047 Periapical abscess without sinus: Secondary | ICD-10-CM | POA: Diagnosis not present

## 2019-02-17 DIAGNOSIS — K0889 Other specified disorders of teeth and supporting structures: Secondary | ICD-10-CM

## 2019-02-17 DIAGNOSIS — O99332 Smoking (tobacco) complicating pregnancy, second trimester: Secondary | ICD-10-CM | POA: Insufficient documentation

## 2019-02-17 MED ORDER — AMOXICILLIN 500 MG PO CAPS: 500.0000 mg | ORAL_CAPSULE | Freq: Two times a day (BID) | ORAL | 0 refills | Status: AC

## 2019-02-17 MED ORDER — HYDROCODONE-ACETAMINOPHEN 5-325 MG PO TABS
1.0000 | ORAL_TABLET | Freq: Once | ORAL | Status: AC
  Administered 2019-02-17: 1 via ORAL
  Filled 2019-02-17: qty 1

## 2019-02-17 MED ORDER — AMOXICILLIN 500 MG PO CAPS
500.0000 mg | ORAL_CAPSULE | Freq: Once | ORAL | Status: AC
  Administered 2019-02-17: 500 mg via ORAL
  Filled 2019-02-17: qty 1

## 2019-02-17 NOTE — MAU Provider Note (Signed)
First Provider Initiated Contact with Patient 02/17/19 [phone]      S Ms. Priscilla [name] is a 23 y.o. G3P1011 at 24.2 weeks who presents to MAU today with complaint of dental pain.  She endorses fetal movement and denies contractions and vaginal concerns including discharge, bleeding, and leaking.    O BP 132/63   Pulse 91   Temp 98.1 F (36.7 C)   Resp 17   Wt 75.6 kg   LMP 08/31/2018   BMI 31.47 kg/m  Physical Exam  Constitutional: She is oriented to person, place, and time. She appears distressed.  HENT:  Head: Normocephalic and atraumatic.  Eyes: Conjunctivae are normal.  Neck: Normal range of motion.  Cardiovascular: Normal rate.  Respiratory: Effort normal.  Musculoskeletal: Normal range of motion.  Neurological: She is alert and oriented to person, place, and time.  Psychiatric: She has a normal mood and affect. Her behavior is normal.    A Pregnant female  Dental Pain Medical screening exam complete  P Patient given the option of transfer to [facility] for further evaluation and is agreeable. Patient may return to MAU as needed for pregnancy related complaints.  Gavin Pound, CNM 02/17/2019 6:55 AM

## 2019-02-17 NOTE — MAU Note (Signed)
Having tooth pain on the left lower side of her mouth, has been going on for 2 days.  Made an appointment for next week but states she is too uncomfortable to wait.  No VB/LOF.  Reports feeling occasional contractions but none now.  + FM.

## 2019-02-17 NOTE — ED Provider Notes (Signed)
[facility] EMERGENCY DEPARTMENT Provider Note   CSN: [id number] Arrival date & time: 02/17/19  3785    History   Chief Complaint Chief Complaint  Patient presents with   Dental Pain    HPI Priscilla [name] is a 23 y.o. female who is currently [redacted] weeks pregnant who presents for evaluation of left upper dental pain x2 days.  Patient states that she has an appointment with a dentist next week but states that the pain was too severe, prompting ED visit.  She states that the pain is worse when attempting to eat.  She has been able to tolerate soft and liquids without any difficulty.  She has no difficulty tolerating her secretions.  She has not noticed any facial swelling.  She thinks maybe her tongue is swollen but is not noticed any lip swelling.  She has not any difficulty talking.  Patient denies any fevers, nausea/vomiting, difficulty breathing.  Patient denies any abdominal pain, vaginal bleeding.     The history is provided by the patient.    Past Medical History:  Diagnosis Date   Headache    Infection    UTI, was dx yesterday    Patient Active Problem List   Diagnosis Date Noted   Bacterial vaginitis 06/02/2018   Normal delivery 12/22/2012   ALLERGIC RHINITIS 03/22/2006   STRABISMUS 02/25/2002    Past Surgical History:  Procedure Laterality Date   WISDOM TOOTH EXTRACTION       OB History    Gravida  3   Para  1   Term  1   Preterm      AB  1   Living  1     SAB  1   TAB      Ectopic      Multiple      Live Births  1            Home Medications    Prior to Admission medications   Medication Sig Start Date End Date Taking? Authorizing Provider  amoxicillin (AMOXIL) 500 MG capsule Take 1 capsule (500 mg total) by mouth 2 (two) times daily for 7 days. 02/17/19 02/24/19  Volanda Napoleon, PA-C  famotidine (PEPCID) 20 MG tablet Take 1 tablet (20 mg total) by mouth 2 (two) times daily. 10/18/18   Leftwich-Kirby, Kathie Dike,  CNM  metoCLOPramide (REGLAN) 10 MG tablet Take 1 tablet (10 mg total) by mouth every 6 (six) hours. 11/06/18   Gavin Pound, CNM  ondansetron (ZOFRAN) 4 MG tablet Take 1 tablet (4 mg total) by mouth every 6 (six) hours. 10/15/18   Marney Setting, NP    Family History Family History  Problem Relation Age of Onset   Asthma Sister    Diabetes Maternal Grandmother    COPD Neg Hx    Hypertension Neg Hx    Stroke Neg Hx     Social History Social History   Tobacco Use   Smoking status: Current Every Day Smoker    Packs/day: 0.25    Years: 1.00    Pack years: 0.25    Types: Cigarettes   Smokeless tobacco: Never Used   Tobacco comment: only 1/3 of a cigarett a day  Substance Use Topics   Alcohol use: Not Currently    Comment: occasionally   Drug use: Yes    Types: Marijuana    Comment: last used 11/05/18     Allergies   Chocolate   Review of Systems Review of  Systems  Constitutional: Negative for fever.  HENT: Positive for dental problem. Negative for facial swelling and trouble swallowing.   Respiratory: Negative for shortness of breath.   Gastrointestinal: Negative for vomiting.  Genitourinary: Negative for dysuria.  All other systems reviewed and are negative.    Physical Exam Updated Vital Signs BP 114/64 (BP Location: Right Arm)    Pulse 96    Temp 98.7 F (37.1 C) (Oral)    Resp 17    Wt 75.6 kg    LMP 08/31/2018    SpO2 100%    BMI 31.47 kg/m   Physical Exam Vitals signs and nursing note reviewed.  Constitutional:      Appearance: She is well-developed.  HENT:     Head: Normocephalic and atraumatic.     Comments: Face is symmetric in appearance without any overlying warmth, erythema, edema.    Mouth/Throat:     Dentition: Abnormal dentition.     Pharynx: Oropharynx is clear. Uvula midline.      Comments: Airways patent, phonation is intact.  No swelling of tongue or lips.  No swelling noted to floor of mouth.  No trismus. Eyes:      General: No scleral icterus.       Right eye: No discharge.        Left eye: No discharge.     Conjunctiva/sclera: Conjunctivae normal.  Pulmonary:     Effort: Pulmonary effort is normal.     Comments: Lungs clear to auscultation bilaterally.  Symmetric chest rise.  No wheezing, rales, rhonchi. Abdominal:     Comments: Gravid abdomen.  Skin:    General: Skin is warm and dry.  Neurological:     Mental Status: She is alert.  Psychiatric:        Speech: Speech normal.        Behavior: Behavior normal.      ED Treatments / Results  Labs (all labs ordered are listed, but only abnormal results are displayed) Labs Reviewed - No data to display  EKG None  Radiology No results found.  Procedures Procedures (including critical care time)  Medications Ordered in ED Medications  HYDROcodone-acetaminophen (NORCO/VICODIN) 5-325 MG per tablet 1 tablet (1 tablet Oral Given 02/17/19 0803)  amoxicillin (AMOXIL) capsule 500 mg (500 mg Oral Given 02/17/19 0803)     Initial Impression / Assessment and Plan / ED Course  I have reviewed the triage vital signs and the nursing notes.  Pertinent labs & imaging results that were available during my care of the patient were reviewed by me and considered in my medical decision making (see chart for details).        23 year old female who presents for evaluation of 2 days of dental pain.  No fevers, facial swelling, difficulty swallowing.  Feels like maybe her tongue might be swollen.  Has an appointment with a dentist next week but states that pain was too much so she came to the emergency department. Patient is afebrile, non-toxic appearing, sitting comfortably on examination table. Vital signs reviewed and stable.  On exam, she has a partially cracked #14.  No evidence of surrounding erythema, edema, fluctuance.  No obvious identifiable dental abscess that would be amenable to I&D here in the ED.  She states she feels like her tongue is swollen.   On exam, she has no evidence of swelling in tongue, lips or floor mouth.  She is talking without any difficulty and has no difficulty breathing.  History/physical exam is not concerning  for peritonsillar abscess, Ludwig angina.  Discussed with patient that she will need follow-up with dentist as previously arranged.  Plan to treat her for possible dental abscess.  Patient with no known drug allergies.  She is currently [redacted] weeks pregnant.  Patient requesting dose of pain medication here in the ED.  I discussed risk versus benefits of pain medication in pregnancy.  Patient expresses understanding and wishes to have 1 dose of pain medication here.  I discussed with patient that at home, she can take Tylenol. At this time, patient exhibits no emergent life-threatening condition that require further evaluation in ED or admission. Patient had ample opportunity for questions and discussion. All patient's questions were answered with full understanding. Strict return precautions discussed. Patient expresses understanding and agreement to plan.   Portions of this note were generated with [employer]. Dictation errors may occur despite best attempts at proofreading.    Final Clinical Impressions(s) / ED Diagnoses   Final diagnoses:  Pain, dental  Dental abscess    ED Discharge Orders         Ordered    amoxicillin (AMOXIL) 500 MG capsule  2 times daily     02/17/19 0803           Volanda Napoleon, PA-C 02/17/19 1341    Fredia Sorrow, MD 02/18/19 1928

## 2019-02-17 NOTE — Discharge Instructions (Signed)
Take antibiotics as directed. Please take all of your antibiotics until finished.  You can take 1000 mg of Tylenol.  Do not exceed 4000 mg of Tylenol a day.  The exam and treatment you received today has been provided on an emergency basis only. This is not a substitute for complete medical or dental care. If your problem worsens or new symptoms (problems) appear, and you are unable to arrange prompt follow-up care with your dentist, call or return to this location. If you do not have a dentist, please follow-up with one on the list provided  CALL YOUR DENTIST OR RETURN IMMEDIATELY IF you develop a fever, rash, difficulty breathing or swallowing, neck or facial swelling, or other potentially serious concerns.   Please follow-up with one of the dental clinics provided to you below or in your paperwork. Call and tell them you were seen in the Emergency Dept and arrange for an appointment. You may have to call multiple places in order to find a place to be seen.  Dental Assistance If the dentist on-call cannot see you, please use the resources below:   Patients with Medicaid: [place], [place] [address], [id number]  If unable to pay, or uninsured, contact [facility] [phone]) or [place] [phone] in [place], [place] in [place] [facility] [facility]) to become qualified for the adult dental clinic  Other [place] [place], [place], [place], [address]    [phone], Ext. [phone]    2nd and 4th Thursday of the month at 6:30am    10 clients each day by appointment, can sometimes see walk-in     patients if someone does not show for an appointment [place], [place], [place], [address]    [phone] [facility]- [address], [place], [place], [address]    [phone]  [place] Department- [phone] [facility] Department- [phone]

## 2019-02-18 ENCOUNTER — Encounter (HOSPITAL_COMMUNITY): Payer: Self-pay

## 2019-02-18 ENCOUNTER — Inpatient Hospital Stay (HOSPITAL_COMMUNITY)
Admission: AD | Admit: 2019-02-18 | Discharge: 2019-02-18 | Disposition: A | Discharge status: Home / Self Care | Payer: Medicaid Other | Attending: Obstetrics and Gynecology | Admitting: Obstetrics and Gynecology

## 2019-02-18 ENCOUNTER — Other Ambulatory Visit: Payer: Self-pay

## 2019-02-18 DIAGNOSIS — F17210 Nicotine dependence, cigarettes, uncomplicated: Secondary | ICD-10-CM | POA: Diagnosis not present

## 2019-02-18 DIAGNOSIS — O219 Vomiting of pregnancy, unspecified: Secondary | ICD-10-CM | POA: Diagnosis not present

## 2019-02-18 DIAGNOSIS — O99332 Smoking (tobacco) complicating pregnancy, second trimester: Secondary | ICD-10-CM | POA: Diagnosis not present

## 2019-02-18 DIAGNOSIS — O212 Late vomiting of pregnancy: Secondary | ICD-10-CM | POA: Insufficient documentation

## 2019-02-18 DIAGNOSIS — Z3A24 24 weeks gestation of pregnancy: Secondary | ICD-10-CM | POA: Insufficient documentation

## 2019-02-18 LAB — URINALYSIS, ROUTINE W REFLEX MICROSCOPIC
Bilirubin Urine: NEGATIVE
Glucose, UA: NEGATIVE mg/dL
Ketones, ur: 80 mg/dL — AB
Nitrite: NEGATIVE
Protein, ur: 30 mg/dL — AB
Specific Gravity, Urine: 1.023 (ref 1.005–1.030)
pH: 5 (ref 5.0–8.0)

## 2019-02-18 LAB — RAPID URINE DRUG SCREEN, HOSP PERFORMED
Amphetamines: NOT DETECTED
Barbiturates: NOT DETECTED
Benzodiazepines: NOT DETECTED
Cocaine: NOT DETECTED
Opiates: NOT DETECTED
Tetrahydrocannabinol: POSITIVE — AB

## 2019-02-18 MED ORDER — LACTATED RINGERS IV BOLUS
1000.0000 mL | Freq: Once | INTRAVENOUS | Status: AC
  Administered 2019-02-18: 15:00:00 1000 mL via INTRAVENOUS

## 2019-02-18 MED ORDER — LACTATED RINGERS IV BOLUS
1000.0000 mL | Freq: Once | INTRAVENOUS | Status: AC
  Administered 2019-02-18: 16:00:00 1000 mL via INTRAVENOUS

## 2019-02-18 MED ORDER — SODIUM CHLORIDE 0.9 % IV SOLN
8.0000 mg | Freq: Once | INTRAVENOUS | Status: AC
  Administered 2019-02-18: 8 mg via INTRAVENOUS
  Filled 2019-02-18: qty 4

## 2019-02-18 MED ORDER — ONDANSETRON 8 MG PO TBDP: 8.0000 mg | ORAL_TABLET | Freq: Three times a day (TID) | ORAL | 0 refills | Status: DC | PRN

## 2019-02-18 NOTE — Discharge Instructions (Signed)
Cannabinoid Hyperemesis Syndrome °Cannabinoid hyperemesis syndrome (CHS) is a condition that causes repeated nausea, vomiting, and abdominal pain after long-term (chronic) use of marijuana (cannabis). People with CHS typically use marijuana 3-5 times a day for many years before they have symptoms, although it is possible to develop CHS with as little as 1 use per day. °Symptoms of CHS may be mild at first but can get worse and more frequent. In some cases, CHS may cause vomiting many times a day, which can lead to weight loss and dehydration. CHS may go away and come back many times (recur). People may not have symptoms or may otherwise be healthy in between CHS attacks. °What are the causes? °The exact cause of this condition is not known. Long-term use of marijuana may over-stimulate certain proteins in the brain that react with chemicals in marijuana (cannabinoid receptors). This over-stimulation may cause CHS. °What are the signs or symptoms? °Symptoms of this condition are often mild during the first few attacks, but they can get worse over time. Symptoms may include: °· Frequent nausea, especially early in the morning. °· Vomiting. °· Abdominal pain. °Taking several hot showers throughout the day can also be a sign of this condition. People with CHS may do this because it relieves symptoms. °How is this diagnosed? °This condition may be diagnosed based on: °· Your symptoms and medical history, including any drug use. °· A physical exam. °You may have tests done to rule out other problems. These tests may include: °· Blood tests. °· Urine tests. °· Imaging tests, such as an X-ray or CT scan. °How is this treated? °Treatment for this condition involves stopping marijuana use. Your health care provider may recommend: °· A drug rehabilitation program, if you have trouble stopping marijuana use. °· Medicines for nausea. °· Hot showers to help relieve symptoms. °Certain creams that contain a substance called  capsaicin may improve symptoms when applied to the abdomen. Ask your health care provider before starting any medicines or other treatments. °Severe nausea and vomiting may require you to stay at the hospital. You may need IV fluids to prevent or treat dehydration. You may also need certain medicines that must be given at the hospital. °Follow these instructions at home: °During an attack ° °· Stay in bed and rest in a dark, quiet room. °· Take anti-nausea medicine as told by your health care provider. °· Try taking hot showers to relieve your symptoms. °After an attack °· Drink small amounts of clear fluids slowly. Gradually add more. °· Once you are able to eat without vomiting, eat soft foods in small amounts every 3-4 hours. °General instructions ° °· Do not use any products that contain marijuana.If you need help quitting, ask your health care provider for resources and treatment options. °· Drink enough fluid to keep your urine pale yellow. Avoid drinking fluids that have a lot of sugar or caffeine, such as coffee and soda. °· Take and apply over-the-counter and prescription medicines only as told by your health care provider. Ask your health care provider before starting any new medicines or treatments. °· Keep all follow-up visits as told by your health care provider. This is important. °Contact a health care provider if: °· Your symptoms get worse. °· You cannot drink fluids without vomiting. °· You have pain and trouble swallowing after an attack. °Get help right away if: °· You cannot stop vomiting. °· You have blood in your vomit or your vomit looks like coffee grounds. °· You have   severe abdominal pain.  You have stools that are bloody or black, or stools that look like tar.  You have symptoms of dehydration, such as: ? Sunken eyes. ? Inability to make tears. ? Cracked lips. ? Dry mouth. ? Decreased urine production. ? Weakness. ? Sleepiness. ? Fainting. Summary  Cannabinoid hyperemesis  syndrome (CHS) is a condition that causes repeated nausea, vomiting, and abdominal pain after long-term use of marijuana.  People with CHS typically use marijuana 3-5 times a day for many years before they have symptoms, although it is possible to develop CHS with as little as 1 use per day.  Treatment for this condition involves stopping marijuana use. Hot showers and capsaicin creams may also help relieve symptoms. Ask your health care provider before starting any medicines or other treatments.  Your health care provider may prescribe medicines to help with nausea.  Get help right away if you have signs of dehydration, such as dry mouth, decreased urine production, or weakness. This information is not intended to replace advice given to you by your health care provider. Make sure you discuss any questions you have with your health care provider. Document Released: 12/12/2016 Document Revised: 12/12/2016 Document Reviewed: 12/12/2016 Elsevier Interactive Patient Education  2019 [employer].

## 2019-02-18 NOTE — MAU Note (Addendum)
Pt states she can't stop vomiting; states vomiting started 6/2 at 0500 has thrown up "a lot, more than 30 times".  Denies fever, diarrhea.  Began taking amoxicillin yesterday.  Pt has phenergan prescription, but does not take it b/c it makes her "high".

## 2019-02-18 NOTE — MAU Provider Note (Signed)
History     CSN: [id number]  Arrival date and time: 02/18/19 1355   First Provider Initiated Contact with Patient 02/18/19 1442      Chief Complaint  Patient presents with  . Emesis   [name] is a 23 y.o. G3P1011 at [redacted]w[redacted]d who presents today with nausea and vomiting. She had this earlier in the pregnancy, but it resolved. This new episode started yesterday. She denies any sick contacts or suspect food intake. She denies any contractions, VB or LOF. She reports normal fetal movement. She was seen yesterday in the ED for toothache. She was given pain medication while there and started on Amoxicillin.   Emesis   This is a new problem. The current episode started yesterday. The problem occurs more than 10 times per day. The problem has been unchanged. The emesis has an appearance of stomach contents. There has been no fever. Pertinent negatives include no chills, diarrhea or fever. She has tried nothing for the symptoms.    OB History    Gravida  3   Para  1   Term  1   Preterm      AB  1   Living  1     SAB  1   TAB      Ectopic      Multiple      Live Births  1           Past Medical History:  Diagnosis Date  . Headache   . Infection    UTI, was dx yesterday    Past Surgical History:  Procedure Laterality Date  . WISDOM TOOTH EXTRACTION      Family History  Problem Relation Age of Onset  . Asthma Sister   . Diabetes Maternal Grandmother   . COPD Neg Hx   . Hypertension Neg Hx   . Stroke Neg Hx     Social History   Tobacco Use  . Smoking status: Current Every Day Smoker    Packs/day: 0.25    Years: 1.00    Pack years: 0.25    Types: Cigarettes  . Smokeless tobacco: Never Used  . Tobacco comment: only 1/3 of a cigarett a day  Substance Use Topics  . Alcohol use: Not Currently    Comment: occasionally  . Drug use: Yes    Types: Marijuana    Comment: last used 1 week ago    Allergies:  Allergies  Allergen Reactions  .  Chocolate Other (See Comments)    Reaction:  Migraines    Medications Prior to Admission  Medication Sig Dispense Refill Last Dose  . amoxicillin (AMOXIL) 500 MG capsule Take 1 capsule (500 mg total) by mouth 2 (two) times daily for 7 days. 14 capsule 0   . famotidine (PEPCID) 20 MG tablet Take 1 tablet (20 mg total) by mouth 2 (two) times daily. 60 tablet 2   . metoCLOPramide (REGLAN) 10 MG tablet Take 1 tablet (10 mg total) by mouth every 6 (six) hours. 15 tablet 2   . ondansetron (ZOFRAN) 4 MG tablet Take 1 tablet (4 mg total) by mouth every 6 (six) hours. 12 tablet 0     Review of Systems  Constitutional: Negative for chills and fever.  Gastrointestinal: Positive for nausea and vomiting. Negative for diarrhea.  Genitourinary: Negative for dysuria, frequency, vaginal bleeding and vaginal discharge.   Physical Exam   Blood pressure 113/60, pulse (!) 106, temperature 98.4 F (36.9 C), temperature source Oral, resp.  rate 18, height 5\' 1"  (1.549 m), weight 72.4 kg, last menstrual period 08/31/2018, SpO2 96 %, unknown if currently breastfeeding.  Physical Exam  Nursing note and vitals reviewed. Constitutional: She is oriented to person, place, and time. She appears well-developed and well-nourished. No distress.  HENT:  Head: Normocephalic.  Cardiovascular: Normal rate.  Respiratory: Effort normal.  GI: Soft. There is no abdominal tenderness. There is no rebound.  Neurological: She is alert and oriented to person, place, and time.  Skin: Skin is warm and dry.  Psychiatric: She has a normal mood and affect.     NST:  Baseline: 145 Variability: moderate Accels: 15x15 Decels: none Toco: none    Results for orders placed or performed during the hospital encounter of 02/18/19 (from the past 24 hour(s))  Urinalysis, Routine w reflex microscopic     Status: Abnormal   Collection Time: 02/18/19  2:32 PM  Result Value Ref Range   Color, Urine YELLOW YELLOW   APPearance HAZY (A)  CLEAR   Specific Gravity, Urine 1.023 1.005 - 1.030   pH 5.0 5.0 - 8.0   Glucose, UA NEGATIVE NEGATIVE mg/dL   Hgb urine dipstick SMALL (A) NEGATIVE   Bilirubin Urine NEGATIVE NEGATIVE   Ketones, ur 80 (A) NEGATIVE mg/dL   Protein, ur 30 (A) NEGATIVE mg/dL   Nitrite NEGATIVE NEGATIVE   Leukocytes,Ua TRACE (A) NEGATIVE   RBC / HPF 0-5 0 - 5 RBC/hpf   WBC, UA 0-5 0 - 5 WBC/hpf   Bacteria, UA RARE (A) NONE SEEN   Squamous Epithelial / LPF 6-10 0 - 5   Mucus PRESENT   Urine rapid drug screen (hosp performed)     Status: Abnormal   Collection Time: 02/18/19  2:32 PM  Result Value Ref Range   Opiates NONE DETECTED NONE DETECTED   Cocaine NONE DETECTED NONE DETECTED   Benzodiazepines NONE DETECTED NONE DETECTED   Amphetamines NONE DETECTED NONE DETECTED   Tetrahydrocannabinol POSITIVE (A) NONE DETECTED   Barbiturates NONE DETECTED NONE DETECTED  ;  MAU Course  Procedures  MDM Patient has had 2L of LR and 8mg  Zofran IV. She has stopped vomiting and is tolerating PO at this time.   Assessment and Plan   1. Nausea/vomiting in pregnancy   2. [redacted] weeks gestation of pregnancy    DC home Comfort measures reviewed  3d Trimester precautions  PTL precautions  Fetal kick counts RX: zofran 8mg  ODT prn  Return to MAU as needed FU with OB as planned  Follow-up Information    Department, [facility] Follow up.   Contact information: [address] [id number] [place] DNP, CNM  02/18/19  5:13 PM

## 2019-05-21 ENCOUNTER — Inpatient Hospital Stay (HOSPITAL_COMMUNITY): Payer: Medicaid Other | Admitting: Anesthesiology

## 2019-05-21 ENCOUNTER — Inpatient Hospital Stay (HOSPITAL_COMMUNITY)
Admission: AD | Admit: 2019-05-21 | Discharge: 2019-05-23 | DRG: 807 | Disposition: A | Discharge status: Home / Self Care | Payer: Medicaid Other | Attending: Obstetrics and Gynecology | Admitting: Obstetrics and Gynecology

## 2019-05-21 ENCOUNTER — Other Ambulatory Visit: Payer: Self-pay

## 2019-05-21 ENCOUNTER — Encounter (HOSPITAL_COMMUNITY): Payer: Self-pay | Admitting: *Deleted

## 2019-05-21 DIAGNOSIS — F1290 Cannabis use, unspecified, uncomplicated: Secondary | ICD-10-CM | POA: Diagnosis present

## 2019-05-21 DIAGNOSIS — F17210 Nicotine dependence, cigarettes, uncomplicated: Secondary | ICD-10-CM | POA: Diagnosis present

## 2019-05-21 DIAGNOSIS — O99324 Drug use complicating childbirth: Secondary | ICD-10-CM | POA: Diagnosis present

## 2019-05-21 DIAGNOSIS — Z20828 Contact with and (suspected) exposure to other viral communicable diseases: Secondary | ICD-10-CM | POA: Diagnosis present

## 2019-05-21 DIAGNOSIS — O26893 Other specified pregnancy related conditions, third trimester: Secondary | ICD-10-CM | POA: Diagnosis present

## 2019-05-21 DIAGNOSIS — O99824 Streptococcus B carrier state complicating childbirth: Principal | ICD-10-CM | POA: Diagnosis present

## 2019-05-21 DIAGNOSIS — O99334 Smoking (tobacco) complicating childbirth: Secondary | ICD-10-CM | POA: Diagnosis present

## 2019-05-21 DIAGNOSIS — Z3A37 37 weeks gestation of pregnancy: Secondary | ICD-10-CM

## 2019-05-21 HISTORY — DX: Anemia, unspecified: D64.9

## 2019-05-21 LAB — ABO/RH: ABO/RH(D): B POS

## 2019-05-21 LAB — CBC
HCT: 35.3 % — ABNORMAL LOW (ref 36.0–46.0)
Hemoglobin: 11.2 g/dL — ABNORMAL LOW (ref 12.0–15.0)
MCH: 23.5 pg — ABNORMAL LOW (ref 26.0–34.0)
MCHC: 31.7 g/dL (ref 30.0–36.0)
MCV: 74.2 fL — ABNORMAL LOW (ref 80.0–100.0)
Platelets: 329 10*3/uL (ref 150–400)
RBC: 4.76 MIL/uL (ref 3.87–5.11)
RDW: 15.5 % (ref 11.5–15.5)
WBC: 15.7 10*3/uL — ABNORMAL HIGH (ref 4.0–10.5)
nRBC: 0 % (ref 0.0–0.2)

## 2019-05-21 LAB — SARS CORONAVIRUS 2 BY RT PCR (HOSPITAL ORDER, PERFORMED IN ~~LOC~~ HOSPITAL LAB): SARS Coronavirus 2: NEGATIVE

## 2019-05-21 LAB — GROUP B STREP BY PCR: Group B strep by PCR: POSITIVE — AB

## 2019-05-21 LAB — TYPE AND SCREEN
ABO/RH(D): B POS
Antibody Screen: NEGATIVE

## 2019-05-21 MED ORDER — ONDANSETRON HCL 4 MG/2ML IJ SOLN
4.0000 mg | Freq: Four times a day (QID) | INTRAMUSCULAR | Status: DC | PRN
  Administered 2019-05-21: 16:00:00 4 mg via INTRAVENOUS
  Filled 2019-05-21: qty 2

## 2019-05-21 MED ORDER — LACTATED RINGERS IV SOLN: 500.0000 mL | INTRAVENOUS | Status: DC | PRN

## 2019-05-21 MED ORDER — OXYCODONE-ACETAMINOPHEN 5-325 MG PO TABS
2.0000 | ORAL_TABLET | ORAL | Status: DC | PRN
  Administered 2019-05-22: 2 via ORAL
  Filled 2019-05-21: qty 2

## 2019-05-21 MED ORDER — LACTATED RINGERS IV SOLN
INTRAVENOUS | Status: DC
  Administered 2019-05-21: 15:00:00 via INTRAVENOUS

## 2019-05-21 MED ORDER — LIDOCAINE HCL (PF) 1 % IJ SOLN
INTRAMUSCULAR | Status: DC | PRN
  Administered 2019-05-21: 3 mL
  Administered 2019-05-21: 8 mL

## 2019-05-21 MED ORDER — OXYCODONE-ACETAMINOPHEN 5-325 MG PO TABS: 1.0000 | ORAL_TABLET | ORAL | Status: DC | PRN

## 2019-05-21 MED ORDER — PENICILLIN G 3 MILLION UNITS IVPB - SIMPLE MED
3.0000 10*6.[IU] | INTRAVENOUS | Status: DC
  Administered 2019-05-21 (×2): 3 10*6.[IU] via INTRAVENOUS
  Filled 2019-05-21 (×4): qty 100

## 2019-05-21 MED ORDER — LIDOCAINE HCL (PF) 1 % IJ SOLN: 30.0000 mL | INTRAMUSCULAR | Status: DC | PRN

## 2019-05-21 MED ORDER — PHENYLEPHRINE 40 MCG/ML (10ML) SYRINGE FOR IV PUSH (FOR BLOOD PRESSURE SUPPORT)
80.0000 ug | PREFILLED_SYRINGE | INTRAVENOUS | Status: DC | PRN
  Filled 2019-05-21: qty 10

## 2019-05-21 MED ORDER — PHENYLEPHRINE 40 MCG/ML (10ML) SYRINGE FOR IV PUSH (FOR BLOOD PRESSURE SUPPORT): 80.0000 ug | PREFILLED_SYRINGE | INTRAVENOUS | Status: DC | PRN

## 2019-05-21 MED ORDER — DIPHENHYDRAMINE HCL 50 MG/ML IJ SOLN
12.5000 mg | INTRAMUSCULAR | Status: DC | PRN
  Administered 2019-05-21 (×2): 12.5 mg via INTRAVENOUS
  Filled 2019-05-21: qty 1

## 2019-05-21 MED ORDER — LACTATED RINGERS IV SOLN
500.0000 mL | Freq: Once | INTRAVENOUS | Status: AC
  Administered 2019-05-21: 500 mL via INTRAVENOUS

## 2019-05-21 MED ORDER — ACETAMINOPHEN 325 MG PO TABS: 650.0000 mg | ORAL_TABLET | ORAL | Status: DC | PRN

## 2019-05-21 MED ORDER — SODIUM CHLORIDE 0.9 % IV SOLN
5.0000 10*6.[IU] | Freq: Once | INTRAVENOUS | Status: AC
  Administered 2019-05-21: 5 10*6.[IU] via INTRAVENOUS
  Filled 2019-05-21: qty 5

## 2019-05-21 MED ORDER — SODIUM CHLORIDE (PF) 0.9 % IJ SOLN
INTRAMUSCULAR | Status: DC | PRN
  Administered 2019-05-21: 12 mL/h via EPIDURAL

## 2019-05-21 MED ORDER — OXYTOCIN BOLUS FROM INFUSION
500.0000 mL | Freq: Once | INTRAVENOUS | Status: AC
  Administered 2019-05-22: 500 mL via INTRAVENOUS

## 2019-05-21 MED ORDER — FENTANYL-BUPIVACAINE-NACL 0.5-0.125-0.9 MG/250ML-% EP SOLN
12.0000 mL/h | EPIDURAL | Status: DC | PRN
  Filled 2019-05-21 (×2): qty 250

## 2019-05-21 MED ORDER — EPHEDRINE 5 MG/ML INJ: 10.0000 mg | INTRAVENOUS | Status: DC | PRN

## 2019-05-21 MED ORDER — SOD CITRATE-CITRIC ACID 500-334 MG/5ML PO SOLN: 30.0000 mL | ORAL | Status: DC | PRN

## 2019-05-21 MED ORDER — OXYTOCIN 40 UNITS IN NORMAL SALINE INFUSION - SIMPLE MED
2.5000 [IU]/h | INTRAVENOUS | Status: DC
  Administered 2019-05-22: 2.5 [IU]/h via INTRAVENOUS
  Filled 2019-05-21: qty 1000

## 2019-05-21 MED ORDER — BUTORPHANOL TARTRATE 1 MG/ML IJ SOLN: 1.0000 mg | INTRAMUSCULAR | Status: DC | PRN

## 2019-05-21 NOTE — Anesthesia Preprocedure Evaluation (Signed)
Anesthesia Evaluation  Patient identified by MRN, date of birth, ID band Patient awake    Reviewed: Allergy & Precautions, H&P , NPO status , Patient's Chart, lab work & pertinent test results  Airway Mallampati: II  TM Distance: >3 FB Neck ROM: Full    Dental no notable dental hx. (+) Teeth Intact   Pulmonary neg pulmonary ROS, Current Smoker,    Pulmonary exam normal breath sounds clear to auscultation       Cardiovascular negative cardio ROS Normal cardiovascular exam Rhythm:Regular Rate:Normal     Neuro/Psych negative neurological ROS  negative psych ROS   GI/Hepatic negative GI ROS, Neg liver ROS,   Endo/Other  negative endocrine ROS  Renal/GU negative Renal ROS     Musculoskeletal negative musculoskeletal ROS (+)   Abdominal   Peds  Hematology negative hematology ROS (+)   Anesthesia Other Findings   Reproductive/Obstetrics (+) Pregnancy                             Anesthesia Physical  Anesthesia Plan  ASA: II  Anesthesia Plan: Epidural   Post-op Pain Management:    Induction:   PONV Risk Score and Plan: 1  Airway Management Planned:   Additional Equipment:   Intra-op Plan:   Post-operative Plan:   Informed Consent: I have reviewed the patients History and Physical, chart, labs and discussed the procedure including the risks, benefits and alternatives for the proposed anesthesia with the patient or authorized representative who has indicated his/her understanding and acceptance.       Plan Discussed with:   Anesthesia Plan Comments:         Anesthesia Quick Evaluation

## 2019-05-21 NOTE — Anesthesia Procedure Notes (Signed)
Epidural Patient location during procedure: OB Start time: 05/21/2019 2:27 PM End time: 05/21/2019 2:48 PM  Staffing Anesthesiologist: Pervis Hocking, DO Performed: anesthesiologist   Preanesthetic Checklist Completed: patient identified, pre-op evaluation, timeout performed, IV checked, risks and benefits discussed and monitors and equipment checked  Epidural Patient position: sitting Prep: site prepped and draped and DuraPrep Patient monitoring: continuous pulse ox, blood pressure, heart rate and cardiac monitor Approach: midline Location: L3-L4 Injection technique: LOR air  Needle:  Needle type: Tuohy  Needle gauge: 17 G Needle length: 9 cm Needle insertion depth: 7 cm Catheter type: closed end flexible Catheter size: 19 Gauge Catheter at skin depth: 12 cm Test dose: negative  Assessment Sensory level: T8 Events: blood not aspirated, injection not painful, no injection resistance, negative IV test and no paresthesia  Additional Notes Patient identified. Risks/Benefits/Options discussed with patient including but not limited to bleeding, infection, nerve damage, paralysis, failed block, incomplete pain control, headache, blood pressure changes, nausea, vomiting, reactions to medication both or allergic, itching and postpartum back pain. Confirmed with bedside nurse the patient's most recent platelet count. Confirmed with patient that they are not currently taking any anticoagulation, have any bleeding history or any family history of bleeding disorders. Patient expressed understanding and wished to proceed. All questions were answered. Sterile technique was used throughout the entire procedure. Please see nursing notes for vital signs. Test dose was given through epidural catheter and negative prior to continuing to dose epidural or start infusion. Warning signs of high block given to the patient including shortness of breath, tingling/numbness in hands, complete motor block,  or any concerning symptoms with instructions to call for help. Patient was given instructions on fall risk and not to get out of bed. All questions and concerns addressed with instructions to call with any issues or inadequate analgesia.  Reason for block:procedure for pain

## 2019-05-21 NOTE — H&P (Signed)
Priscilla [name] is a 23 y.o. female, G3 P1011, EGA 37+ weeks with EDC 9-20 presenting for ctx.  On eval in MAU cervix changed from 2-3 to 4 cm.  Prenatal care complicated by early low lying placenta that resolved.  OB History    Gravida  3   Para  1   Term  1   Preterm      AB  1   Living  1     SAB  1   TAB      Ectopic      Multiple      Live Births  1          Past Medical History:  Diagnosis Date  . Anemia   . Headache   . Infection    UTI   Past Surgical History:  Procedure Laterality Date  . WISDOM TOOTH EXTRACTION     Family History: family history includes Asthma in her sister; Diabetes in her maternal grandmother; Healthy in her father and mother. Social History:  reports that she has been smoking cigarettes. She has a 0.25 pack-year smoking history. She has never used smokeless tobacco. She reports previous alcohol use. She reports previous drug use. Drug: Marijuana.     Maternal Diabetes: No Genetic Screening: Normal Maternal Ultrasounds/Referrals: Normal Fetal Ultrasounds or other Referrals:  None Maternal Substance Abuse:  No Significant Maternal Medications:  None Significant Maternal Lab Results:  None Other Comments:  None  Review of Systems  Respiratory: Negative.   Cardiovascular: Negative.    Maternal Medical History:  Reason for admission: Contractions.   Contractions: Frequency: regular.   Perceived severity is strong.    Fetal activity: Perceived fetal activity is normal.    Prenatal complications: no prenatal complications Prenatal Complications - Diabetes: none.    Dilation: 4(tight) Effacement (%): 70 Station: -3 Exam by:: jolynn Blood pressure 118/67, pulse 88, temperature 98.6 F (37 C), temperature source Oral, resp. rate 16, height 5\' 1"  (1.549 m), weight 80.7 kg, last menstrual period 08/31/2018, unknown if currently breastfeeding. Maternal Exam:  Uterine Assessment: Contraction strength is moderate.   Contraction frequency is irregular.   Abdomen: Patient reports no abdominal tenderness. Estimated fetal weight is 7 lbs.   Fetal presentation: vertex  Introitus: Normal vulva. Normal vagina.  Amniotic fluid character: not assessed.  Pelvis: adequate for delivery.      Fetal Exam Fetal Monitor Review: Mode: ultrasound.   Baseline rate: 130-140.  Variability: moderate (6-25 bpm).   Pattern: accelerations present and no decelerations.    Fetal State Assessment: Category I - tracings are normal.     Physical Exam  Vitals reviewed. Constitutional: She appears well-developed and well-nourished.  Cardiovascular: Normal rate and regular rhythm.  Respiratory: Effort normal. No respiratory distress.  GI: Soft.  Genitourinary:    Vulva normal.     Prenatal labs: ABO, Rh: --/--/PENDING (09/03 1210) Antibody: PENDING (09/03 1210) Rubella: Immune (03/25 0000) RPR: Nonreactive (03/25 0000)  HBsAg: Negative (03/25 0000)  HIV: Non-reactive (03/25 0000)  GBS:   none  Assessment/Plan: IUP at 37 weeks in early labor, unknown GBS-no current risk factors.  Waiting for epidural, rapid GBS ordered, COVID also pending, monitor progress   Blane Ohara Joretta Eads 05/21/2019, 1:14 PM

## 2019-05-21 NOTE — Progress Notes (Signed)
Comfortable with epidural Afeb, VSS FHT- 130-140, Cat I VE-4/50/-2, vtx, AROM clear GBS +, SARS neg On PCN for GBS, monitor progress with AROM and see if needs augmentation

## 2019-05-21 NOTE — MAU Note (Signed)
Contractions started last night, gotten worse. No bleeding or leaking. No recent exams.

## 2019-05-22 ENCOUNTER — Encounter (HOSPITAL_COMMUNITY): Payer: Self-pay

## 2019-05-22 LAB — RPR: RPR Ser Ql: NONREACTIVE

## 2019-05-22 MED ORDER — SENNOSIDES-DOCUSATE SODIUM 8.6-50 MG PO TABS
2.0000 | ORAL_TABLET | ORAL | Status: DC
  Administered 2019-05-22: 23:00:00 2 via ORAL
  Filled 2019-05-22: qty 2

## 2019-05-22 MED ORDER — PRENATAL MULTIVITAMIN CH
1.0000 | ORAL_TABLET | Freq: Every day | ORAL | Status: DC
  Administered 2019-05-22: 1 via ORAL
  Filled 2019-05-22: qty 1

## 2019-05-22 MED ORDER — METHYLERGONOVINE MALEATE 0.2 MG PO TABS: 0.2000 mg | ORAL_TABLET | ORAL | Status: DC | PRN

## 2019-05-22 MED ORDER — SIMETHICONE 80 MG PO CHEW: 80.0000 mg | CHEWABLE_TABLET | ORAL | Status: DC | PRN

## 2019-05-22 MED ORDER — WITCH HAZEL-GLYCERIN EX PADS: 1.0000 "application " | MEDICATED_PAD | CUTANEOUS | Status: DC | PRN

## 2019-05-22 MED ORDER — ZOLPIDEM TARTRATE 5 MG PO TABS: 5.0000 mg | ORAL_TABLET | Freq: Every evening | ORAL | Status: DC | PRN

## 2019-05-22 MED ORDER — OXYCODONE HCL 5 MG PO TABS
10.0000 mg | ORAL_TABLET | ORAL | Status: DC | PRN
  Administered 2019-05-22 – 2019-05-23 (×5): 10 mg via ORAL
  Filled 2019-05-22 (×5): qty 2

## 2019-05-22 MED ORDER — METHYLERGONOVINE MALEATE 0.2 MG/ML IJ SOLN: 0.2000 mg | INTRAMUSCULAR | Status: DC | PRN

## 2019-05-22 MED ORDER — ONDANSETRON HCL 4 MG PO TABS: 4.0000 mg | ORAL_TABLET | ORAL | Status: DC | PRN

## 2019-05-22 MED ORDER — MAGNESIUM HYDROXIDE 400 MG/5ML PO SUSP: 30.0000 mL | ORAL | Status: DC | PRN

## 2019-05-22 MED ORDER — MEASLES, MUMPS & RUBELLA VAC IJ SOLR: 0.5000 mL | Freq: Once | INTRAMUSCULAR | Status: DC

## 2019-05-22 MED ORDER — ACETAMINOPHEN 325 MG PO TABS
650.0000 mg | ORAL_TABLET | ORAL | Status: DC | PRN
  Administered 2019-05-22: 18:00:00 650 mg via ORAL
  Filled 2019-05-22: qty 2

## 2019-05-22 MED ORDER — ONDANSETRON HCL 4 MG/2ML IJ SOLN: 4.0000 mg | INTRAMUSCULAR | Status: DC | PRN

## 2019-05-22 MED ORDER — OXYCODONE HCL 5 MG PO TABS
5.0000 mg | ORAL_TABLET | ORAL | Status: DC | PRN
  Administered 2019-05-22 (×2): 5 mg via ORAL
  Filled 2019-05-22 (×3): qty 1

## 2019-05-22 MED ORDER — TETANUS-DIPHTH-ACELL PERTUSSIS 5-2.5-18.5 LF-MCG/0.5 IM SUSP: 0.5000 mL | Freq: Once | INTRAMUSCULAR | Status: DC

## 2019-05-22 MED ORDER — BENZOCAINE-MENTHOL 20-0.5 % EX AERO: 1.0000 "application " | INHALATION_SPRAY | CUTANEOUS | Status: DC | PRN

## 2019-05-22 MED ORDER — IBUPROFEN 600 MG PO TABS
600.0000 mg | ORAL_TABLET | Freq: Four times a day (QID) | ORAL | Status: DC
  Administered 2019-05-22 – 2019-05-23 (×5): 600 mg via ORAL
  Filled 2019-05-22 (×5): qty 1

## 2019-05-22 MED ORDER — DIBUCAINE (PERIANAL) 1 % EX OINT: 1.0000 "application " | TOPICAL_OINTMENT | CUTANEOUS | Status: DC | PRN

## 2019-05-22 MED ORDER — DIPHENHYDRAMINE HCL 25 MG PO CAPS: 25.0000 mg | ORAL_CAPSULE | Freq: Four times a day (QID) | ORAL | Status: DC | PRN

## 2019-05-22 MED ORDER — COCONUT OIL OIL: 1.0000 "application " | TOPICAL_OIL | Status: DC | PRN

## 2019-05-22 NOTE — Anesthesia Postprocedure Evaluation (Signed)
Anesthesia Post Note  Patient: Priscilla [name]  Procedure(s) Performed: AN AD [place]     Patient location during evaluation: Mother Baby Anesthesia Type: Epidural Level of consciousness: awake and alert, oriented and patient cooperative Pain management: pain level controlled Vital Signs Assessment: post-procedure vital signs reviewed and stable Respiratory status: spontaneous breathing Cardiovascular status: stable Postop Assessment: no headache, epidural receding, patient able to bend at knees, no signs of nausea or vomiting and able to ambulate Anesthetic complications: no Comments: Pt interviewed via phone consultation d/t COVID 19 precautions.  Pt. States she is walking.  Pain score 0.    Last Vitals:  Vitals:   05/22/19 0330 05/22/19 0731  BP: 112/64 104/61  Pulse: 95 73  Resp: 16 18  Temp: 37.2 C 36.7 C  SpO2: 100%     Last Pain:  Vitals:   05/22/19 0731  TempSrc: Oral  PainSc: 0-No pain   Pain Goal:                Epidural/Spinal Function Cutaneous sensation: Normal sensation (05/22/19 0731)  Rico Sheehan

## 2019-05-22 NOTE — Progress Notes (Signed)
POSTPARTUM PROGRESS NOTE  Post Partum Day #0  Subjective:  No acute events overnight.  Pt denies problems with ambulating, voiding or po intake.  She denies nausea or vomiting.  Pain is well controlled.  She has had flatus. She has not had bowel movement.  Lochia Moderate.   Of note patient was teary when I entered the room. Did not want to speak about matters. Assured her I am her if she needs anything  Objective: Blood pressure 104/61, pulse 73, temperature 98.1 F (36.7 C), temperature source Oral, resp. rate 18, height 5\' 1"  (1.549 m), weight 80.7 kg, last menstrual period 08/31/2018, SpO2 100 %, unknown if currently breastfeeding.  Physical Exam:  General: alert, cooperative and no distress Lochia:normal flow Chest: CTAB Heart: RRR no m/r/g Abdomen: +BS, soft, nontender Uterine Fundus: firm, 2cm below umbilicus Extremities: neg edema, neg calf TTP BL, neg Homans BL  Recent Labs    05/21/19 1150  HGB 11.2*  HCT 35.3*    Assessment/Plan:  ASSESSMENT: Priscilla [name] is a 22 y.o. [id number] s/p SVD @ [redacted]w[redacted]d. PNC c/b THC use, GBS pos.   Plan for discharge tomorrow, Breastfeeding, Lactation consult, Social Work consult and Contraception Depo before discharge   LOS: 1 day

## 2019-05-22 NOTE — Progress Notes (Signed)
CSW acknowledged consult and attempted to meet with MOB. However, Nursing Student in the room tending to patient. CSW will meet with MOB at a later time.  Priscilla [name], [place]  [facility] [employer] [phone]

## 2019-05-22 NOTE — Progress Notes (Signed)
Pt called RN and states that current pain meds is not relieving her pain. Pt states that she received percocet upon arrival to the unit and will like percocet instead of her oxy. Pt sates that she DOES NOT want to take  oxycodon anymore since it doesn't control her pain in her back. RN informed pt that she only has oxy ordered and that the percocet was d/c. RN encouraged pt to use the heat packs for her back pain along with her scheduled motrin and tylenol .pt states that motrin and tylenol are not effective nor are the  heat packs. Pt has been in her bed all day and hasn't been walking much. RN encouraged pt to walk to see if that would help if she was  having gas pain. Pt states she would but asked  RN to call MD. MD called by RN upon pt request and MD stated that pt could only have  Motrin and tylenol for pain. Upon relaying this information to patient, pt asked if she could speak with the MD herself. RN Called MD upon pt's request.

## 2019-05-22 NOTE — Progress Notes (Signed)
Instructed to take pt a K-Pad due to her being in a lot of pain. K-pad taken in, instructed pt how to turn off/on etc. Pt was sitting on side of bed laughing and talking on her phone. Pt called out 10 mins later asking if baby can go to nursery so she can go outside and smoke. Baby taken to nursery, pt off unit. RN notified.

## 2019-05-23 MED ORDER — ACETAMINOPHEN 325 MG PO TABS: 650.0000 mg | ORAL_TABLET | Freq: Four times a day (QID) | ORAL | 1 refills | Status: DC | PRN

## 2019-05-23 MED ORDER — MEDROXYPROGESTERONE ACETATE 150 MG/ML IM SUSP
150.0000 mg | Freq: Once | INTRAMUSCULAR | Status: AC
  Administered 2019-05-23: 07:00:00 150 mg via INTRAMUSCULAR
  Filled 2019-05-23: qty 1

## 2019-05-23 MED ORDER — IBUPROFEN 600 MG PO TABS: 600.0000 mg | ORAL_TABLET | Freq: Three times a day (TID) | ORAL | 1 refills | Status: DC | PRN

## 2019-05-23 NOTE — Progress Notes (Signed)
POSTPARTUM PROGRESS NOTE  Post Partum Day #1  Subjective:  No acute events overnight.  Pt denies problems with ambulating, voiding or po intake.  She denies nausea or vomiting. As per earlier notes, patient has asked for opiod pain control which she states helps with back pain. Reviewed that pain may be 2.2 labor and that ambulation, heat and alternating Tyelnol and ibuprofen is best and can help avoid constipation. Reviewed no opiods at discharge.  She has had flatus. She has not had bowel movement.  Lochia Moderate.   Objective: Blood pressure (!) 101/53, pulse 71, temperature 98.1 F (36.7 C), temperature source Oral, resp. rate 18, height 5\' 1"  (1.549 m), weight 80.7 kg, last menstrual period 08/31/2018, SpO2 100 %, unknown if currently breastfeeding.  Physical Exam:  General: alert, cooperative and no distress Lochia:normal flow Chest: CTAB Heart: RRR no m/r/g Abdomen: +BS, soft, nontender Uterine Fundus: firm, 2cm below umbilicus Extremities: neg edema, neg calf TTP BL, neg Homans BL  Recent Labs    05/21/19 1150  HGB 11.2*  HCT 35.3*    Assessment/Plan:  ASSESSMENT: [place] T Petitjean is a 23 y.o. [id number] s/p VAVD @ [redacted]w[redacted]d. PNC c/b THC use, GBS pos.   Discharge home, Breastfeeding, Lactation consult, Social Work consult and Contraception Depo before discharge   LOS: 2 days

## 2019-05-23 NOTE — Clinical Social Work Maternal (Signed)
CLINICAL SOCIAL WORK MATERNAL/CHILD NOTE  Patient Details  Name: Priscilla [name] MRN: [id number] Date of Birth: April 18, 1996  Date:  05/23/2019  Clinical Social Worker Initiating Note:  Laurey Arrow Date/Time: Initiated:  05/23/19/1057     Child's Name:  Priscilla [name]   Biological Parents:  Mother, Father   Need for Interpreter:  None   Reason for Referral:  Current Substance Use/Substance Use During Pregnancy (hx of marijuana use during pregnancy.)   Address:  [place] [address] MOB also reported that she resides at [place]   Phone number:  [phone] (home)     Additional phone number: MOB's mother's number [phone] Household Members/Support Persons (HM/SP):   Household Member/Support Person 1, Household Member/Support Person 2(MOB reported that MOB and FOB resides with FOB's mother)   HM/SP Name Relationship DOB or Age  HM/SP -1 Philis Fendt FOB 01/22/1997  HM/SP -2 Langston Masker daughter 12/21/2012(MOB reported that child resides with maternal great grandmother but MOB has custody.)  HM/SP -3        HM/SP -4        HM/SP -5        HM/SP -6        HM/SP -7        HM/SP -8          Natural Supports (not living in the home):  Extended Family, Immediate Family, Parent(Per MOB, FOB's family will also provide supports.)   Professional Supports: None   Employment: Unemployed   Type of Work:     Education:  Programmer, systems   Homebound arranged:    Museum/gallery curator Resources:  Medicaid   Other Resources:  Physicist, medical , [place] Considerations Which May Impact Care:  None reported  Strengths:  Ability to meet basic needs , Engineer, materials, Home prepared for child    Psychotropic Medications:         Pediatrician:    [place] area  Pediatrician List:   [facility] ([address])  [place]      Pediatrician Fax Number:    Risk Factors/Current Problems:  Substance Use    Cognitive State:  Alert , Able to Concentrate , Goal Oriented , Insightful , Linear Thinking    Mood/Affect:  Happy , Interested , Bright , Relaxed    CSW Assessment: CSW met with MOB in room 508 to complete an assessment for hx of substance use.  When CSW arrived, MOB was in bed bottle feeding infant..  MOB and infant appeared comfortable and happy.  CSW explained CSW's role and was receptive to meeting with CSW.   CSW asked about MOB's SA hx an MOB openly shared her use of marijuana throughout her pregnancy.  MOB reported her last use was 2 months ago and MOB smoke to help reduce her nausea. CSW explained hospital's SA policy and MOB was understanding. MOB denied the use of all other illicit substances and declined resources for outpatient treatment. MOB acknowledged a hx of CPS involvement and shared her last open case was about 1 year ago.     MOB reports having all essential items for infant (new car seat and bassinet) and feeling prepared to parent.  MOB stated, "This is my baby; I was young with my oldest daughter so my mom and grandmother took  over; this one is all mines."  MOB smiled has she looked at her daughter.   There are no barriers to discharge  CSW Plan/Description:  [place], No Further Intervention Required/No Barriers to Discharge, Sudden Infant Death Syndrome (SIDS) Education, Perinatal Mood and Anxiety Disorder (PMADs) Education, Other Patient/Family Education, Other Information/Referral to [employer], CSW Will Continue to Monitor Umbilical Cord Tissue Drug Screen Results and Make Report if Warranted   Laurey Arrow, MSW, LCSW Clinical Social Work [phone]   Dimple Nanas, LCSW 05/23/2019, 11:01 AM

## 2019-05-23 NOTE — Discharge Summary (Signed)
OB Discharge Summary     Patient Name: Priscilla [name] DOB: [birth year] MRN: [id number]  Date of admission: 05/21/2019 Delivering MD: Willis Modena, TODD   Date of discharge: 05/23/2019  Admitting diagnosis: Extreme Abd Pain Intrauterine pregnancy: [redacted]w[redacted]d     Secondary diagnosis:  Active Problems:   Indication for care in labor or delivery   Vacuum-assisted vaginal delivery  Additional problems: THC use antenatally     Discharge diagnosis: Term Pregnancy Delivered                                                                                                Post partum procedures:none  Augmentation: AROM  Complications: None  Hospital course:  Onset of Labor With Vaginal Delivery     23 y.o. yo [id number] at [redacted]w[redacted]d was admitted in Latent Labor on 05/21/2019. Patient had an uncomplicated labor course as follows:  Membrane Rupture Time/Date: 4:48 PM ,05/21/2019   Intrapartum Procedures: Episiotomy: None [1]                                         Lacerations:  1st degree [2]  Patient had a delivery of a Viable infant. 05/22/2019  Information for the patient's newborn:  Priscilla, [name] Girl Priscilla [id number]  Delivery Method: Vag-Spont     [name] had an uncomplicated postpartum course.  She is ambulating, tolerating a regular diet, passing flatus, and urinating well. Patient is discharged home in stable condition on 05/23/19.   Physical exam  Vitals:   05/22/19 1117 05/22/19 1515 05/22/19 2132 05/23/19 0625  BP: 128/72 115/74 (!) 101/53 106/66  Pulse: 81 75 71 70  Resp: 18     Temp: 98.2 F (36.8 C) 98.1 F (36.7 C) 98.1 F (36.7 C) 98.4 F (36.9 C)  TempSrc: Oral Oral Oral Oral  SpO2:    100%  Weight:      Height:       General: alert Lochia: appropriate Uterine Fundus: firm Incision: N/A DVT Evaluation: No evidence of DVT seen on physical exam. Negative Homan's sign. Labs: Lab Results  Component Value Date   WBC 15.7 (H) 05/21/2019   HGB 11.2 (L) 05/21/2019   HCT 35.3 (L)  05/21/2019   MCV 74.2 (L) 05/21/2019   PLT 329 05/21/2019   CMP Latest Ref Rng & Units 08/27/2018  Glucose 70 - 99 mg/dL 98  BUN 6 - 20 mg/dL 10  Creatinine 0.44 - 1.00 mg/dL 0.72  Sodium 135 - 145 mmol/L 133(L)  Potassium 3.5 - 5.1 mmol/L 3.7  Chloride 98 - 111 mmol/L 102  CO2 22 - 32 mmol/L 22  Calcium 8.9 - 10.3 mg/dL 8.0(L)  Total Protein 6.5 - 8.1 g/dL 6.9  Total Bilirubin 0.3 - 1.2 mg/dL 1.1  Alkaline Phos 38 - 126 U/L 51  AST 15 - 41 U/L 15  ALT 0 - 44 U/L 11    Discharge instruction: per After Visit Summary and "Baby and Me Booklet".  After visit meds:  Allergies as of 05/23/2019  Reactions   Chocolate Other (See Comments)   Reaction:  Migraines   Amoxicillin    Patient doesn't know reaction but has taken drug before      Medication List    STOP taking these medications   metoCLOPramide 10 MG tablet Commonly known as: REGLAN   ondansetron 8 MG disintegrating tablet Commonly known as: Zofran ODT     TAKE these medications   acetaminophen 325 MG tablet Commonly known as: Tylenol Take 2 tablets (650 mg total) by mouth every 6 (six) hours as needed for moderate pain (for pain scale < 4).   famotidine 20 MG tablet Commonly known as: Pepcid Take 1 tablet (20 mg total) by mouth 2 (two) times daily.   ibuprofen 600 MG tablet Commonly known as: ADVIL Take 1 tablet (600 mg total) by mouth every 8 (eight) hours as needed.   prenatal multivitamin Tabs tablet Take 1 tablet by mouth daily at 12 noon.       Diet: routine diet  Activity: Advance as tolerated. Pelvic rest for 6 weeks.   Outpatient follow up:6 weeks Follow up Appt:No future appointments. Follow up Visit:No follow-ups on file.  Postpartum contraception: Depo Provera  Newborn Data: Live born female  Birth Weight: 6 lb 7 oz (2920 g) APGAR: 52, 9  Newborn Delivery   Birth date/time: 05/22/2019 00:20:00 Delivery type: Vaginal, Vacuum (Extractor)      Baby Feeding:  Bottle Disposition:home with mother   05/23/2019 Priscilla Boston, MD

## 2019-06-01 ENCOUNTER — Encounter (HOSPITAL_COMMUNITY): Payer: Self-pay | Admitting: *Deleted

## 2019-06-01 ENCOUNTER — Other Ambulatory Visit: Payer: Self-pay

## 2019-06-01 ENCOUNTER — Inpatient Hospital Stay (HOSPITAL_COMMUNITY)
Admission: AD | Admit: 2019-06-01 | Discharge: 2019-06-02 | Disposition: A | Discharge status: Home / Self Care | Payer: Medicaid Other | Attending: Obstetrics and Gynecology | Admitting: Obstetrics and Gynecology

## 2019-06-01 DIAGNOSIS — R1032 Left lower quadrant pain: Secondary | ICD-10-CM | POA: Diagnosis not present

## 2019-06-01 DIAGNOSIS — R109 Unspecified abdominal pain: Secondary | ICD-10-CM | POA: Insufficient documentation

## 2019-06-01 DIAGNOSIS — O9089 Other complications of the puerperium, not elsewhere classified: Secondary | ICD-10-CM | POA: Diagnosis not present

## 2019-06-01 DIAGNOSIS — R1031 Right lower quadrant pain: Secondary | ICD-10-CM

## 2019-06-01 DIAGNOSIS — F17210 Nicotine dependence, cigarettes, uncomplicated: Secondary | ICD-10-CM | POA: Insufficient documentation

## 2019-06-01 DIAGNOSIS — Z880 Allergy status to penicillin: Secondary | ICD-10-CM | POA: Diagnosis not present

## 2019-06-01 LAB — URINALYSIS, ROUTINE W REFLEX MICROSCOPIC
Bilirubin Urine: NEGATIVE
Glucose, UA: NEGATIVE mg/dL
Ketones, ur: 5 mg/dL — AB
Nitrite: NEGATIVE
Protein, ur: NEGATIVE mg/dL
Specific Gravity, Urine: 1.008 (ref 1.005–1.030)
WBC, UA: >50 WBC/hpf — ABNORMAL HIGH (ref 0–5)
pH: 6 (ref 5.0–8.0)

## 2019-06-01 NOTE — MAU Note (Signed)
Stomach hurts like really bad.  Started 2 days ago. (did not call dr).  Feels like contractions, like she is having a baby. Bleeding has slowed, only changing 3 times a day, no clots. Denies fever.  Her stomach hurts when she urinates, not like burning, hurts when she gets up.  Having bm's, no problem.

## 2019-06-01 NOTE — MAU Note (Signed)
PT SAYS SHE DEL VAG ON 9-4.  SAYS HER ENTIRE ABD HURTS  ALL THE TIME  NO MATTER WHAT SHE DOES . TOOK IBUPROFEN TODAY AT 6PM- 600MG  AND TYLENOL REG  2 TABS - NO RELIEF . BOTTLE FEEDING

## 2019-06-02 ENCOUNTER — Inpatient Hospital Stay (HOSPITAL_COMMUNITY): Payer: Medicaid Other

## 2019-06-02 DIAGNOSIS — R1032 Left lower quadrant pain: Secondary | ICD-10-CM

## 2019-06-02 DIAGNOSIS — R1031 Right lower quadrant pain: Secondary | ICD-10-CM

## 2019-06-02 MED ORDER — OXYCODONE HCL 5 MG PO TABS
5.0000 mg | ORAL_TABLET | Freq: Once | ORAL | Status: AC
  Administered 2019-06-02: 5 mg via ORAL
  Filled 2019-06-02: qty 1

## 2019-06-02 NOTE — Discharge Instructions (Signed)
Abdominal Pain, Adult Abdominal pain can be caused by many things. Often, abdominal pain is not serious and it gets better with no treatment or by being treated at home. However, sometimes abdominal pain is serious. Your health care provider will do a medical history and a physical exam to try to determine the cause of your abdominal pain. Follow these instructions at home:  Take over-the-counter and prescription medicines only as told by your health care provider. Do not take a laxative unless told by your health care provider.  Drink enough fluid to keep your urine clear or pale yellow.  Watch your condition for any changes.  Keep all follow-up visits as told by your health care provider. This is important. Contact a health care provider if:  Your abdominal pain changes or gets worse.  You are not hungry or you lose weight without trying.  You are constipated or have diarrhea for more than 2-3 days.  You have pain when you urinate or have a bowel movement.  Your abdominal pain wakes you up at night.  Your pain gets worse with meals, after eating, or with certain foods.  You are throwing up and cannot keep anything down.  You have a fever. Get help right away if:  Your pain does not go away as soon as your health care provider told you to expect.  You cannot stop throwing up.  Your pain is only in areas of the abdomen, such as the right side or the left lower portion of the abdomen.  You have bloody or black stools, or stools that look like tar.  You have severe pain, cramping, or bloating in your abdomen.  You have signs of dehydration, such as: ? Dark urine, very little urine, or no urine. ? Cracked lips. ? Dry mouth. ? Sunken eyes. ? Sleepiness. ? Weakness. This information is not intended to replace advice given to you by your health care provider. Make sure you discuss any questions you have with your health care provider. Document Released: 06/13/2005 Document  Revised: 03/23/2016 Document Reviewed: 02/15/2016 Elsevier Interactive Patient Education  2020 [employer]  

## 2019-06-02 NOTE — MAU Provider Note (Signed)
History     CSN: [id number]  Arrival date and time: 06/01/19 1708   First Provider Initiated Contact with Patient 06/02/19 0045      Chief Complaint  Patient presents with  . Abdominal Pain    Priscilla [name] is a 23 y.o. [id number] at Unknown who receives care at [facility].  She presents today for Abdominal Pain that started about 3 days ago.  She states the pain is constant and is unable to describe it, but states that "it hurts."  She states the pain is increased with movement and is improved with "staying in one spot." She reports that she has taken ibuprofen and tylenol without relief of her symptoms. She states that she has been having intermittent stomach pain since her delivery on Sept 4th.  Patient denies diarrhea or constipation, but reports that she has increased abdominal pain with urination. She rates the pain a 10/10. Patient reports a bowel movement 2 days ago without difficulty.  Patient denies sexual activity or anything in the vagina since delivery.      OB History    Gravida  3   Para  2   Term  2   Preterm      AB  1   Living  2     SAB  1   TAB      Ectopic      Multiple  0   Live Births  2           Past Medical History:  Diagnosis Date  . Anemia   . Headache   . Infection    UTI    Past Surgical History:  Procedure Laterality Date  . WISDOM TOOTH EXTRACTION      Family History  Problem Relation Age of Onset  . Healthy Mother   . Healthy Father   . Asthma Sister   . Diabetes Maternal Grandmother   . COPD Neg Hx   . Hypertension Neg Hx   . Stroke Neg Hx     Social History   Tobacco Use  . Smoking status: Current Every Day Smoker    Packs/day: 0.25    Years: 1.00    Pack years: 0.25    Types: Cigarettes  . Smokeless tobacco: Never Used  Substance Use Topics  . Alcohol use: Not Currently    Comment: occasionally  . Drug use: Not Currently    Types: Marijuana    Comment: SMOKED ON SAT     Allergies:   Allergies  Allergen Reactions  . Chocolate Other (See Comments)    Reaction:  Migraines  . Amoxicillin     Patient doesn't know reaction but has taken drug before    No medications prior to admission.    Review of Systems  Constitutional: Negative for chills and fever.  Gastrointestinal: Positive for abdominal pain. Negative for constipation and diarrhea.  Genitourinary: Negative for difficulty urinating, dysuria, vaginal bleeding and vaginal discharge.  Musculoskeletal: Negative for back pain.  Neurological: Negative for dizziness, numbness and headaches.   Physical Exam   Blood pressure 119/61, pulse (!) 106, temperature 98.8 F (37.1 C), temperature source Oral, resp. rate 16, SpO2 100 %, not currently breastfeeding.  Physical Exam  Constitutional: She is oriented to person, place, and time. She appears well-developed and well-nourished.  HENT:  Head: Normocephalic and atraumatic.  Eyes: Conjunctivae are normal.  Neck: Normal range of motion.  Cardiovascular: Normal rate.  Respiratory: Effort normal.  GI: Soft. Bowel sounds are  normal. There is abdominal tenderness in the right lower quadrant and left lower quadrant. There is rebound and guarding. There is no rigidity.  Musculoskeletal: Normal range of motion.  Neurological: She is alert and oriented to person, place, and time.  Skin: Skin is warm and dry.  Psychiatric: She has a normal mood and affect. Her behavior is normal.    MAU Course  Procedures Results for orders placed or performed during the hospital encounter of 06/01/19 (from the past 24 hour(s))  Urinalysis, Routine w reflex microscopic     Status: Abnormal   Collection Time: 06/01/19  6:49 PM  Result Value Ref Range   Color, Urine YELLOW YELLOW   APPearance CLEAR CLEAR   Specific Gravity, Urine 1.008 1.005 - 1.030   pH 6.0 5.0 - 8.0   Glucose, UA NEGATIVE NEGATIVE mg/dL   Hgb urine dipstick LARGE (A) NEGATIVE   Bilirubin Urine NEGATIVE NEGATIVE    Ketones, ur 5 (A) NEGATIVE mg/dL   Protein, ur NEGATIVE NEGATIVE mg/dL   Nitrite NEGATIVE NEGATIVE   Leukocytes,Ua LARGE (A) NEGATIVE   RBC / HPF 21-50 0 - 5 RBC/hpf   WBC, UA >50 (H) 0 - 5 WBC/hpf   Bacteria, UA RARE (A) NONE SEEN   Squamous Epithelial / LPF 0-5 0 - 5   Mucus PRESENT    Budding Yeast PRESENT    US Abdomen Complete  Result Date: 06/02/2019 CLINICAL DATA:  Diffuse abdominal pain EXAM: ABDOMEN ULTRASOUND COMPLETE COMPARISON:  None. FINDINGS: Gallbladder: Patient is nonfasting. Gallbladder is largely decompressed at time of exam. No frank wall thickening. No visualized gallstones. No pericholecystic fluid. Sonographic Percell Miller sign is reportedly negative. Common bile duct: Diameter: 3.3, normal Liver: No focal lesion identified. Within normal limits in parenchymal echogenicity. Portal vein is patent on color Doppler imaging with normal direction of blood flow towards the liver. IVC: No abnormality visualized. Pancreas: Poorly visualized due to bowel gas. Included portions are unremarkable. Spleen: 8.7 cm in length, size and appearance within normal limits. Right Kidney: Length: 10.9 cm. Echogenicity within normal limits. No mass or hydronephrosis visualized. Left Kidney: Length: 10 cm. Echogenicity within normal limits. No mass or hydronephrosis visualized. Abdominal aorta: No aneurysm visualized. Other findings: None. IMPRESSION: Partially contracted gallbladder secondary to a nonfasting state. No gross abnormality of the gallbladder. Otherwise unremarkable abdominal ultrasound. Electronically Signed   By: Lovena Le M.D.   On: 06/02/2019 01:55    MDM Physical Exam Pain Medication Abdominal US  Assessment and Plan  23 year old  42 Days Postpartum Abdominal Pain  -Exam findings discussed -Will send for abdominal US -Offered and accepts pain medication. -Will await results.   Maryann Conners MSN, CNM 06/02/2019, 12:45 AM   Reassessment (2:13 AM)  -[place] results return  without significant findings. -In room to discuss. -Patient reports she has not received pain medication. -Will inform nurse and reassess.   Reassessment (2:47 AM)  -Patient reports improvement of pain with oxycodone dosing. -Patient encouraged to follow up with primary care provider for continued pain. -Encouraged to call or go to [facility] if symptoms worsen or with the onset of new symptoms. -Discharged to home in improved condition.  Maryann Conners MSN, CNM

## 2019-06-04 LAB — CULTURE, OB URINE: Culture: 10000 — AB

## 2019-10-18 ENCOUNTER — Encounter (HOSPITAL_COMMUNITY): Payer: Self-pay | Admitting: Emergency Medicine

## 2019-10-18 ENCOUNTER — Emergency Department (HOSPITAL_COMMUNITY)
Admission: EM | Admit: 2019-10-18 | Discharge: 2019-10-18 | Disposition: A | Discharge status: Home / Self Care | Payer: Medicaid Other | Attending: Emergency Medicine | Admitting: Emergency Medicine

## 2019-10-18 ENCOUNTER — Other Ambulatory Visit: Payer: Self-pay

## 2019-10-18 DIAGNOSIS — R1030 Lower abdominal pain, unspecified: Secondary | ICD-10-CM

## 2019-10-18 DIAGNOSIS — R1084 Generalized abdominal pain: Secondary | ICD-10-CM | POA: Diagnosis not present

## 2019-10-18 DIAGNOSIS — F17210 Nicotine dependence, cigarettes, uncomplicated: Secondary | ICD-10-CM | POA: Insufficient documentation

## 2019-10-18 LAB — URINALYSIS, ROUTINE W REFLEX MICROSCOPIC
Bacteria, UA: NONE SEEN
Bilirubin Urine: NEGATIVE
Glucose, UA: NEGATIVE mg/dL
Ketones, ur: NEGATIVE mg/dL
Nitrite: NEGATIVE
Protein, ur: NEGATIVE mg/dL
Specific Gravity, Urine: 1.009 (ref 1.005–1.030)
pH: 5 (ref 5.0–8.0)

## 2019-10-18 LAB — POC URINE PREG, ED: Preg Test, Ur: NEGATIVE

## 2019-10-18 NOTE — ED Triage Notes (Signed)
C/o lower abd pain cramping that started 1 1/2 hours ago and was relieved with acetaminophen.   Denies nausea, vomiting, and diarrhea.

## 2019-10-18 NOTE — ED Provider Notes (Signed)
[facility] EMERGENCY DEPARTMENT Provider Note   CSN: [id number] Arrival date & time: 10/18/19  1321     History Chief Complaint  Patient presents with  . Abdominal Pain    Priscilla [name] is a 24 y.o. female.  24 y.o female with a PMH of Anemia presents to the ED with a chief complaint of abdominal pain x 2 hours PTA. Patient describes a lower abdominal cramping which began two hours ago. She reports taking tylenol which relieved her pain. She reports concern for pain returning. Patient is currently sexually active, reports her LMP was sometime at the end of last month. She reports her menstrual cycle has changed since she had a vaginal delivery in September. She reports some mild nausea. She denies any vaginal discharge, vaginal bleeding, vomiting, urinary symptoms or other complaints.   The history is provided by the patient.       Past Medical History:  Diagnosis Date  . Anemia   . Headache   . Infection    UTI    Patient Active Problem List   Diagnosis Date Noted  . Vacuum-assisted vaginal delivery 05/22/2019  . Indication for care in labor or delivery 05/21/2019  . Bacterial vaginitis 06/02/2018  . Normal delivery 12/22/2012  . ALLERGIC RHINITIS 03/22/2006  . STRABISMUS 02/25/2002    Past Surgical History:  Procedure Laterality Date  . WISDOM TOOTH EXTRACTION       OB History    Gravida  3   Para  2   Term  2   Preterm      AB  1   Living  2     SAB  1   TAB      Ectopic      Multiple  0   Live Births  2           Family History  Problem Relation Age of Onset  . Healthy Mother   . Healthy Father   . Asthma Sister   . Diabetes Maternal Grandmother   . COPD Neg Hx   . Hypertension Neg Hx   . Stroke Neg Hx     Social History   Tobacco Use  . Smoking status: Current Every Day Smoker    Packs/day: 0.25    Years: 1.00    Pack years: 0.25    Types: Cigarettes  . Smokeless tobacco: Never Used  Substance Use  Topics  . Alcohol use: Not Currently    Comment: occasionally  . Drug use: Not Currently    Types: Marijuana    Home Medications Prior to Admission medications   Not on File    Allergies    Chocolate and Amoxicillin  Review of Systems   Review of Systems  Constitutional: Negative for fever.  HENT: Negative for sore throat.   Respiratory: Negative for shortness of breath.   Cardiovascular: Negative for chest pain.  Gastrointestinal: Positive for abdominal pain and nausea. Negative for diarrhea and vomiting.  Genitourinary: Negative for flank pain.  Musculoskeletal: Negative for back pain.  Skin: Negative for pallor and wound.  Neurological: Negative for facial asymmetry and headaches.    Physical Exam Updated Vital Signs BP 111/60 (BP Location: Left Arm)   Pulse 97   Temp 98.5 F (36.9 C) (Oral)   Resp 16   SpO2 99%   Physical Exam Vitals and nursing note reviewed.  Constitutional:      General: She is not in acute distress.    Appearance: She  is well-developed.  HENT:     Head: Normocephalic and atraumatic.     Mouth/Throat:     Pharynx: No oropharyngeal exudate.  Eyes:     Pupils: Pupils are equal, round, and reactive to light.  Cardiovascular:     Rate and Rhythm: Regular rhythm.     Heart sounds: Normal heart sounds.  Pulmonary:     Effort: Pulmonary effort is normal. No respiratory distress.     Breath sounds: Normal breath sounds.  Abdominal:     General: Bowel sounds are normal. There is no distension.     Palpations: Abdomen is soft.     Tenderness: There is abdominal tenderness in the suprapubic area.     Comments: Mild TTP on the suprapubic area. Negative McBurneys.   Musculoskeletal:        General: No tenderness or deformity.     Cervical back: Normal range of motion.     Right lower leg: No edema.     Left lower leg: No edema.  Skin:    General: Skin is warm and dry.  Neurological:     Mental Status: She is alert and oriented to person,  place, and time.     ED Results / Procedures / Treatments   Labs (all labs ordered are listed, but only abnormal results are displayed) Labs Reviewed  URINALYSIS, ROUTINE W REFLEX MICROSCOPIC - Abnormal; Notable for the following components:      Result Value   Color, Urine STRAW (*)    APPearance HAZY (*)    Hgb urine dipstick MODERATE (*)    Leukocytes,Ua SMALL (*)    All other components within normal limits  POC URINE PREG, ED    EKG None  Radiology No results found.  Procedures Procedures (including critical care time)  Medications Ordered in ED Medications - No data to display  ED Course  I have reviewed the triage vital signs and the nursing notes.  Pertinent labs & imaging results that were available during my care of the patient were reviewed by me and considered in my medical decision making (see chart for details).  Clinical Course as of Oct 18 1531  Sun Oct 18, 2019  1515 Hgb urine dipstickMarland Kitchen): MODERATE [JS]    Clinical Course User Index [JS] Janeece Fitting, PA-C   MDM Rules/Calculators/A&P    Patient with no pertinent past medical history presents to the ED with complaints of lower abdominal pain which began 2 hours prior to arrival.  Reports taking some Tylenol which did help with her symptoms.  During my evaluation abdomen is soft, nontender to palpation, mild discomfort with lower abdominal palpation.  She does report her last menstrual cycle was sometime around December, not had a cycle yet this month.  She is currently sexually active.  Will obtain urine pregnancy at this time.  Patient is nontoxic-appearing, non-ill-appearing.  She has had no vomiting, diarrhea, did have some nausea.  Last bowel movement was this morning without any blood.  Vitals are within normal limits.  UA showed small amount of hemoglobin, small leuks, 6-10 white cells however specimen is likely mostly contaminated due to high squamous present.   3:32 PM Serial abdominal exams  remain benign.  I have discussed the results of her urine sample with patient.  She states "I am very hungry can you please go home ".  P.o. challenge was ordered however patient reports she is headed out to eat.  We will have her follow-up with PCP as needed.  Portions of this note were generated with Lobby[employer]. Dictation errors may occur despite best attempts at proofreading.  Final Clinical Impression(s) / ED Diagnoses Final diagnoses:  Lower abdominal pain    Rx / DC Orders ED Discharge Orders    None       Janeece Fitting, PA-C 10/18/19 1533    Charlesetta Shanks, MD 10/26/19 [phone]

## 2019-10-18 NOTE — ED Notes (Addendum)
Pt left before updated vitals could be obtained and before pt could receive discharge paperwork. RN unable to receive pt signature at departure.

## 2019-10-18 NOTE — Discharge Instructions (Addendum)
Your urinalysis did not show any signs of infection.  Your pregnancy test was also negative.  These follow-up with your primary care physician as needed.  If you experience any vomiting, worsening symptoms please return to the emergency department.

## 2019-10-18 NOTE — ED Notes (Signed)
Fluids provided for PO challenge. Pt tolerating well.

## 2020-05-27 ENCOUNTER — Other Ambulatory Visit: Payer: Self-pay

## 2020-05-27 ENCOUNTER — Emergency Department (HOSPITAL_COMMUNITY)
Admission: EM | Admit: 2020-05-27 | Discharge: 2020-05-27 | Disposition: A | Discharge status: Home / Self Care | Payer: Medicaid Other | Attending: Emergency Medicine | Admitting: Emergency Medicine

## 2020-05-27 DIAGNOSIS — Z3202 Encounter for pregnancy test, result negative: Secondary | ICD-10-CM | POA: Diagnosis not present

## 2020-05-27 DIAGNOSIS — F17210 Nicotine dependence, cigarettes, uncomplicated: Secondary | ICD-10-CM | POA: Insufficient documentation

## 2020-05-27 DIAGNOSIS — R1030 Lower abdominal pain, unspecified: Secondary | ICD-10-CM | POA: Diagnosis not present

## 2020-05-27 LAB — POC URINE PREG, ED: Preg Test, Ur: NEGATIVE

## 2020-05-27 NOTE — Discharge Instructions (Signed)
Your pregnancy test today was negative.  Please call the [facility] at [facility] for women today to schedule close follow-up appointment.  You can discuss contraceptive options with them as well as discussed the occasionally irregular periods.  Return to the emergency department if any concerning signs or symptoms develop such as fevers, vomiting, loss of consciousness, severe abdominal pains.

## 2020-05-27 NOTE — ED Provider Notes (Signed)
[facility] EMERGENCY DEPARTMENT Provider Note   CSN: [id number] Arrival date & time: 05/27/20  1045     History Chief Complaint  Patient presents with  . Possible Pregnancy    Priscilla [name] is a 24 y.o. female with history of anemia presents for evaluation of irregular menses, requesting pregnancy test.  She states that her last period was early last month and she is approximately 1 week late.  She reports lower abdominal cramping "like I am about to start my period soon, but it has not come on".  She denies fevers, vaginal itching or discharge, nausea, vomiting, or any other complaints.  She has not tried anything for her symptoms.  She has not taken any home pregnancy test because she states that with her last 2 pregnancies she has had negative pregnancy tests.  She does not currently see an OB/GYN.She is currently sexually active but does not use protection. The history is provided by the patient.       Past Medical History:  Diagnosis Date  . Anemia   . Headache   . Infection    UTI    Patient Active Problem List   Diagnosis Date Noted  . Vacuum-assisted vaginal delivery 05/22/2019  . Indication for care in labor or delivery 05/21/2019  . Bacterial vaginitis 06/02/2018  . Normal delivery 12/22/2012  . ALLERGIC RHINITIS 03/22/2006  . STRABISMUS 02/25/2002    Past Surgical History:  Procedure Laterality Date  . WISDOM TOOTH EXTRACTION       OB History    Gravida  3   Para  2   Term  2   Preterm      AB  1   Living  2     SAB  1   TAB      Ectopic      Multiple  0   Live Births  2           Family History  Problem Relation Age of Onset  . Healthy Mother   . Healthy Father   . Asthma Sister   . Diabetes Maternal Grandmother   . COPD Neg Hx   . Hypertension Neg Hx   . Stroke Neg Hx     Social History   Tobacco Use  . Smoking status: Current Every Day Smoker    Packs/day: 0.25    Years: 1.00    Pack years:  0.25    Types: Cigarettes  . Smokeless tobacco: Never Used  Vaping Use  . Vaping Use: Never used  Substance Use Topics  . Alcohol use: Not Currently    Comment: occasionally  . Drug use: Not Currently    Types: Marijuana    Home Medications Prior to Admission medications   Not on File    Allergies    Chocolate and Amoxicillin  Review of Systems   Review of Systems  Constitutional: Negative for chills and fever.  Gastrointestinal: Positive for abdominal pain (Cramping). Negative for nausea and vomiting.  Genitourinary: Positive for menstrual problem. Negative for dysuria, vaginal bleeding and vaginal pain.  All other systems reviewed and are negative.   Physical Exam Updated Vital Signs BP 111/63   Pulse 94   Temp 99.3 F (37.4 C) (Oral)   Resp 20   SpO2 100%   Physical Exam Vitals and nursing note reviewed.  Constitutional:      General: She is not in acute distress.    Appearance: She is well-developed.  Comments: Resting comfortably in chair  HENT:     Head: Normocephalic and atraumatic.  Eyes:     General:        Right eye: No discharge.        Left eye: No discharge.     Conjunctiva/sclera: Conjunctivae normal.  Neck:     Vascular: No JVD.     Trachea: No tracheal deviation.  Cardiovascular:     Rate and Rhythm: Normal rate and regular rhythm.  Pulmonary:     Effort: Pulmonary effort is normal.     Breath sounds: Normal breath sounds.  Abdominal:     General: Bowel sounds are normal. There is no distension.     Palpations: Abdomen is soft.     Tenderness: There is no abdominal tenderness. There is no right CVA tenderness, left CVA tenderness, guarding or rebound.  Skin:    General: Skin is warm and dry.     Findings: No erythema.  Neurological:     Mental Status: She is alert.  Psychiatric:        Behavior: Behavior normal.     ED Results / Procedures / Treatments   Labs (all labs ordered are listed, but only abnormal results are  displayed) Labs Reviewed  POC URINE PREG, ED    EKG None  Radiology No results found.  Procedures Procedures (including critical care time)  Medications Ordered in ED Medications - No data to display  ED Course  I have reviewed the triage vital signs and the nursing notes.  Pertinent labs & imaging results that were available during my care of the patient were reviewed by me and considered in my medical decision making (see chart for details).    MDM Rules/Calculators/A&P                          Patient here requesting pregnancy test.  1 week late with regards to her menses.  She is afebrile, vital signs are stable.  She is nontoxic in appearance.  Urine pregnancy test is negative here.  Her abdomen is soft nontender with no rebound or guarding.  Doubt acute surgical abdominal pathology and have a low suspicion of ectopic pregnancy, PID, TOA or ovarian torsion given benign physical examination and negative pregnancy test.  We discussed different contraceptive options and I recommended close follow-up with OB/GYN for reevaluation and further discussion.  Discussed strict ED return precautions. Patient verbalized understanding of and agreement with plan and is safe for discharge home at this time.  Final Clinical Impression(s) / ED Diagnoses Final diagnoses:  Pregnancy examination or test, negative result    Rx / DC Orders ED Discharge Orders    None       Renita Papa, PA-C 05/27/20 1149    Breck Coons, MD 05/27/20 1201

## 2020-05-27 NOTE — ED Notes (Signed)
Patient verbalizes understanding of discharge instructions. Opportunity for questioning and answers were provided. Armband removed by staff, pt discharged from ED ambulatory.   

## 2020-05-27 NOTE — ED Triage Notes (Signed)
Pt arrives to ED requesting preg test d/t missed period.

## 2021-03-17 ENCOUNTER — Emergency Department (HOSPITAL_COMMUNITY)
Admission: EM | Admit: 2021-03-17 | Discharge: 2021-03-17 | Disposition: A | Discharge status: Home / Self Care | Payer: Medicaid Other | Attending: Emergency Medicine | Admitting: Emergency Medicine

## 2021-03-17 ENCOUNTER — Emergency Department (HOSPITAL_COMMUNITY): Payer: Medicaid Other

## 2021-03-17 DIAGNOSIS — R102 Pelvic and perineal pain: Secondary | ICD-10-CM | POA: Diagnosis not present

## 2021-03-17 DIAGNOSIS — F17210 Nicotine dependence, cigarettes, uncomplicated: Secondary | ICD-10-CM | POA: Insufficient documentation

## 2021-03-17 DIAGNOSIS — N83201 Unspecified ovarian cyst, right side: Secondary | ICD-10-CM | POA: Diagnosis not present

## 2021-03-17 DIAGNOSIS — R1032 Left lower quadrant pain: Secondary | ICD-10-CM | POA: Diagnosis present

## 2021-03-17 LAB — URINALYSIS, ROUTINE W REFLEX MICROSCOPIC
Bacteria, UA: NONE SEEN
Bilirubin Urine: NEGATIVE
Glucose, UA: NEGATIVE mg/dL
Ketones, ur: NEGATIVE mg/dL
Nitrite: NEGATIVE
Protein, ur: NEGATIVE mg/dL
Specific Gravity, Urine: 1.002 — ABNORMAL LOW (ref 1.005–1.030)
pH: 6 (ref 5.0–8.0)

## 2021-03-17 LAB — COMPREHENSIVE METABOLIC PANEL
ALT: 13 U/L (ref 0–44)
AST: 18 U/L (ref 15–41)
Albumin: 3.8 g/dL (ref 3.5–5.0)
Alkaline Phosphatase: 53 U/L (ref 38–126)
Anion gap: 8 (ref 5–15)
BUN: <5 mg/dL — ABNORMAL LOW (ref 6–20)
CO2: 24 mmol/L (ref 22–32)
Calcium: 8.8 mg/dL — ABNORMAL LOW (ref 8.9–10.3)
Chloride: 104 mmol/L (ref 98–111)
Creatinine, Ser: 0.75 mg/dL (ref 0.44–1.00)
GFR, Estimated: >60 mL/min (ref >60)
Glucose, Bld: 101 mg/dL — ABNORMAL HIGH (ref 70–99)
Potassium: 3.8 mmol/L (ref 3.5–5.1)
Sodium: 136 mmol/L (ref 135–145)
Total Bilirubin: 0.4 mg/dL (ref 0.3–1.2)
Total Protein: 6.8 g/dL (ref 6.5–8.1)

## 2021-03-17 LAB — CBC
HCT: 36.4 % (ref 36.0–46.0)
Hemoglobin: 11.8 g/dL — ABNORMAL LOW (ref 12.0–15.0)
MCH: 25.3 pg — ABNORMAL LOW (ref 26.0–34.0)
MCHC: 32.4 g/dL (ref 30.0–36.0)
MCV: 78.1 fL — ABNORMAL LOW (ref 80.0–100.0)
Platelets: 345 10*3/uL (ref 150–400)
RBC: 4.66 MIL/uL (ref 3.87–5.11)
RDW: 16 % — ABNORMAL HIGH (ref 11.5–15.5)
WBC: 11 10*3/uL — ABNORMAL HIGH (ref 4.0–10.5)
nRBC: 0 % (ref 0.0–0.2)

## 2021-03-17 LAB — I-STAT BETA HCG BLOOD, ED (MC, WL, AP ONLY): I-stat hCG, quantitative: <5 m[IU]/mL (ref <5)

## 2021-03-17 LAB — LIPASE, BLOOD: Lipase: 23 U/L (ref 11–51)

## 2021-03-17 MED ORDER — HYDROCODONE-ACETAMINOPHEN 5-325 MG PO TABS: 1.0000 | ORAL_TABLET | Freq: Four times a day (QID) | ORAL | 0 refills | Status: DC | PRN

## 2021-03-17 MED ORDER — ONDANSETRON 4 MG PO TBDP
8.0000 mg | ORAL_TABLET | Freq: Once | ORAL | Status: AC
  Administered 2021-03-17: 8 mg via ORAL
  Filled 2021-03-17: qty 2

## 2021-03-17 MED ORDER — HYDROCODONE-ACETAMINOPHEN 5-325 MG PO TABS
2.0000 | ORAL_TABLET | Freq: Once | ORAL | Status: AC
  Administered 2021-03-17: 2 via ORAL
  Filled 2021-03-17: qty 2

## 2021-03-17 NOTE — ED Triage Notes (Signed)
Pt c/o LLQ abd pain x3 days, states tylenol does not work. Denis urinary sx

## 2021-03-17 NOTE — ED Provider Notes (Signed)
[facility] EMERGENCY DEPARTMENT Provider Note   CSN: [id number] Arrival date & time: 03/17/21  0204     History Chief Complaint  Patient presents with   Abdominal Pain    Priscilla [name] is a 25 y.o. female.  The history is provided by the patient.  Abdominal Pain Pain location:  LLQ Pain quality: aching   Pain radiates to:  L flank Pain severity:  Severe Onset quality:  Sudden Duration:  3 days Timing:  Constant Progression:  Worsening Chronicity:  New Relieved by:  Nothing Worsened by:  Nothing Associated symptoms: no chest pain, no diarrhea, no dysuria, no fever, no shortness of breath, no vaginal bleeding, no vaginal discharge and no vomiting   Risk factors: has not had multiple surgeries   Patient reports onset of left lower quadrant abdominal pain 3 days ago.  It is not improving with home medications.  She has never had this before.  Denies any abdominal surgery    Past Medical History:  Diagnosis Date   Anemia    Headache    Infection    UTI    Patient Active Problem List   Diagnosis Date Noted   Vacuum-assisted vaginal delivery 05/22/2019   Indication for care in labor or delivery 05/21/2019   Bacterial vaginitis 06/02/2018   Normal delivery 12/22/2012   ALLERGIC RHINITIS 03/22/2006   STRABISMUS 02/25/2002    Past Surgical History:  Procedure Laterality Date   WISDOM TOOTH EXTRACTION       OB History     Gravida  3   Para  2   Term  2   Preterm      AB  1   Living  2      SAB  1   IAB      Ectopic      Multiple  0   Live Births  2           Family History  Problem Relation Age of Onset   Healthy Mother    Healthy Father    Asthma Sister    Diabetes Maternal Grandmother    COPD Neg Hx    Hypertension Neg Hx    Stroke Neg Hx     Social History   Tobacco Use   Smoking status: Every Day    Packs/day: 0.25    Years: 1.00    Pack years: 0.25    Types: Cigarettes   Smokeless tobacco: Never   Vaping Use   Vaping Use: Never used  Substance Use Topics   Alcohol use: Not Currently    Comment: occasionally   Drug use: Not Currently    Types: Marijuana    Home Medications Prior to Admission medications   Medication Sig Start Date End Date Taking? Authorizing Provider  diphenhydramine-acetaminophen (TYLENOL PM) 25-500 MG TABS tablet Take 1 tablet by mouth daily as needed (headache/pain).   Yes [provider]    Allergies    Chocolate and Amoxicillin  Review of Systems   Review of Systems  Constitutional:  Negative for fever.  Respiratory:  Negative for shortness of breath.   Cardiovascular:  Negative for chest pain.  Gastrointestinal:  Positive for abdominal pain. Negative for diarrhea and vomiting.  Genitourinary:  Negative for dysuria, vaginal bleeding and vaginal discharge.  All other systems reviewed and are negative.  Physical Exam Updated Vital Signs BP 124/78   Pulse 71   Temp 99.4 F (37.4 C) (Oral)   Resp 18  SpO2 100%   Physical Exam CONSTITUTIONAL: Well developed/well nourished HEAD: Normocephalic/atraumatic EYES: EOMI/PERRL, no icterus NECK: supple no meningeal signs SPINE/BACK:entire spine nontender CV: S1/S2 noted, no murmurs/rubs/gallops noted LUNGS: Lungs are clear to auscultation bilaterally, no apparent distress ABDOMEN: soft, mild LLQ tenderness, no rebound or guarding, bowel sounds noted throughout abdomen GU:no cva tenderness NEURO: Pt is awake/alert/appropriate, moves all extremitiesx4.  No facial droop.   EXTREMITIES: pulses normal/equal, full ROM SKIN: warm, color normal PSYCH: no abnormalities of mood noted, alert and oriented to situation  ED Results / Procedures / Treatments   Labs (all labs ordered are listed, but only abnormal results are displayed) Labs Reviewed  COMPREHENSIVE METABOLIC PANEL - Abnormal; Notable for the following components:      Result Value   Glucose, Bld 101 (*)    BUN <5 (*)    Calcium 8.8  (*)    All other components within normal limits  CBC - Abnormal; Notable for the following components:   WBC 11.0 (*)    Hemoglobin 11.8 (*)    MCV 78.1 (*)    MCH 25.3 (*)    RDW 16.0 (*)    All other components within normal limits  URINALYSIS, ROUTINE W REFLEX MICROSCOPIC - Abnormal; Notable for the following components:   Color, Urine STRAW (*)    Specific Gravity, Urine 1.002 (*)    Hgb urine dipstick SMALL (*)    Leukocytes,Ua SMALL (*)    All other components within normal limits  LIPASE, BLOOD  I-STAT BETA HCG BLOOD, ED ([facility], [facility], [facility] ONLY)    EKG None  Radiology CT Renal Stone Study  Result Date: 03/17/2021 CLINICAL DATA:  Left flank pain, left lower quadrant abdominal pain EXAM: CT ABDOMEN AND PELVIS WITHOUT CONTRAST TECHNIQUE: Multidetector CT imaging of the abdomen and pelvis was performed following the standard protocol without IV contrast. COMPARISON:  None. FINDINGS: Lower chest: No acute abnormality. Hepatobiliary: No focal liver abnormality is seen. No gallstones, gallbladder wall thickening, or biliary dilatation. Pancreas: Unremarkable Spleen: Unremarkable Adrenals/Urinary Tract: Adrenal glands are unremarkable. Kidneys are normal, without renal calculi, focal lesion, or hydronephrosis. Bladder is unremarkable. Stomach/Bowel: Stomach is within normal limits. Appendix appears normal. No evidence of bowel wall thickening, distention, or inflammatory changes. No free intraperitoneal gas or fluid. Vascular/Lymphatic: No significant vascular findings are present. No enlarged abdominal or pelvic lymph nodes. Reproductive: The right ovary is mildly enlarged and contains a poorly characterized cystic lesion measuring roughly 3.8 x 3.1 x 5.5 cm. The uterus and left ovary are unremarkable. Other: No abdominal wall hernia identified. Musculoskeletal: No acute bone abnormality. IMPRESSION: Indeterminate 5.5 cm right ovarian cystic lesion with enlargement of the right ovary. Dedicated  pelvic sonography with Doppler evaluation of the ovaries is recommended for further evaluation to better characterize the cystic lesion and exclude ovarian torsion as a potential cause for asymmetric right ovarian enlargement. Electronically Signed   By: Fidela Salisbury MD   On: 03/17/2021 06:18    Procedures Procedures   Medications Ordered in ED Medications  ondansetron (ZOFRAN-ODT) disintegrating tablet 8 mg (8 mg Oral Given 03/17/21 3009)  HYDROcodone-acetaminophen (NORCO/VICODIN) 5-325 MG per tablet 2 tablet (2 tablets Oral Given 03/17/21 2330)    ED Course  I have reviewed the triage vital signs and the nursing notes.  Pertinent labs & imaging results that were available during my care of the patient were reviewed by me and considered in my medical decision making (see chart for details).    MDM Rules/Calculators/A&P  This patient presents to the ED for concern of abdominal pain, this involves an extensive number of treatment options, and is a complaint that carries with it a high risk of complications and morbidity.  The differential diagnosis includes kidney stone, diverticulitis, ovarian cyst, ovarian torsion, urinary tract infection   Lab Tests:  I Ordered, reviewed, and interpreted labs, which included urinalysis, CBC, electrolytes, pregnancy test  Medicines ordered:  I ordered medication Vicodin for pain  Imaging Studies ordered:  I ordered imaging studies which included CT renal  I independently visualized and interpreted imaging which showed right ovarian cyst  Reevaluation:  After the interventions stated above, I reevaluated the patient and found mild improvement in pain   6:52 AM CT scan revealed a large right ovarian cyst.  Patient only had pain in the left.  We initially agreed to proceed with transvaginal ultrasound for further evaluation. Patient then declined and prefers to be discharged.  She is in no acute distress this time.  No  focal right-sided tenderness.  Low suspicion for torsion or TOA [facility]  Final Clinical Impression(s) / ED Diagnoses Final diagnoses:  Pelvic pain  Cyst of right ovary    Rx / DC Orders ED Discharge Orders          Ordered    HYDROcodone-acetaminophen (NORCO/VICODIN) 5-325 MG tablet  Every 6 hours PRN        03/17/21 0651             Ripley Fraise, MD 03/17/21 [id number]

## 2021-03-21 ENCOUNTER — Other Ambulatory Visit: Payer: Self-pay

## 2021-03-21 ENCOUNTER — Emergency Department (HOSPITAL_COMMUNITY)
Admission: EM | Admit: 2021-03-21 | Discharge: 2021-03-21 | Disposition: A | Discharge status: Home / Self Care | Payer: Medicaid Other | Attending: Emergency Medicine | Admitting: Emergency Medicine

## 2021-03-21 ENCOUNTER — Encounter (HOSPITAL_COMMUNITY): Payer: Self-pay

## 2021-03-21 ENCOUNTER — Emergency Department (HOSPITAL_COMMUNITY): Payer: Medicaid Other

## 2021-03-21 DIAGNOSIS — F17210 Nicotine dependence, cigarettes, uncomplicated: Secondary | ICD-10-CM | POA: Diagnosis not present

## 2021-03-21 DIAGNOSIS — N83201 Unspecified ovarian cyst, right side: Secondary | ICD-10-CM | POA: Diagnosis not present

## 2021-03-21 DIAGNOSIS — R1030 Lower abdominal pain, unspecified: Secondary | ICD-10-CM

## 2021-03-21 DIAGNOSIS — R1031 Right lower quadrant pain: Secondary | ICD-10-CM

## 2021-03-21 LAB — I-STAT BETA HCG BLOOD, ED (MC, WL, AP ONLY): I-stat hCG, quantitative: <5 m[IU]/mL (ref <5)

## 2021-03-21 LAB — URINALYSIS, ROUTINE W REFLEX MICROSCOPIC
Bacteria, UA: NONE SEEN
Bilirubin Urine: NEGATIVE
Glucose, UA: NEGATIVE mg/dL
Ketones, ur: NEGATIVE mg/dL
Nitrite: NEGATIVE
Protein, ur: NEGATIVE mg/dL
Specific Gravity, Urine: 1.012 (ref 1.005–1.030)
pH: 6 (ref 5.0–8.0)

## 2021-03-21 LAB — COMPREHENSIVE METABOLIC PANEL
ALT: 12 U/L (ref 0–44)
AST: 18 U/L (ref 15–41)
Albumin: 3.5 g/dL (ref 3.5–5.0)
Alkaline Phosphatase: 54 U/L (ref 38–126)
Anion gap: 8 (ref 5–15)
BUN: 9 mg/dL (ref 6–20)
CO2: 21 mmol/L — ABNORMAL LOW (ref 22–32)
Calcium: 8.7 mg/dL — ABNORMAL LOW (ref 8.9–10.3)
Chloride: 106 mmol/L (ref 98–111)
Creatinine, Ser: 0.78 mg/dL (ref 0.44–1.00)
GFR, Estimated: >60 mL/min (ref >60)
Glucose, Bld: 110 mg/dL — ABNORMAL HIGH (ref 70–99)
Potassium: 4 mmol/L (ref 3.5–5.1)
Sodium: 135 mmol/L (ref 135–145)
Total Bilirubin: 0.4 mg/dL (ref 0.3–1.2)
Total Protein: 6.5 g/dL (ref 6.5–8.1)

## 2021-03-21 LAB — CBC
HCT: 35.4 % — ABNORMAL LOW (ref 36.0–46.0)
Hemoglobin: 11.6 g/dL — ABNORMAL LOW (ref 12.0–15.0)
MCH: 25.4 pg — ABNORMAL LOW (ref 26.0–34.0)
MCHC: 32.8 g/dL (ref 30.0–36.0)
MCV: 77.6 fL — ABNORMAL LOW (ref 80.0–100.0)
Platelets: 326 10*3/uL (ref 150–400)
RBC: 4.56 MIL/uL (ref 3.87–5.11)
RDW: 15.9 % — ABNORMAL HIGH (ref 11.5–15.5)
WBC: 15.2 10*3/uL — ABNORMAL HIGH (ref 4.0–10.5)
nRBC: 0 % (ref 0.0–0.2)

## 2021-03-21 LAB — LIPASE, BLOOD: Lipase: 24 U/L (ref 11–51)

## 2021-03-21 MED ORDER — IBUPROFEN 800 MG PO TABS: 800.0000 mg | ORAL_TABLET | Freq: Three times a day (TID) | ORAL | 0 refills | Status: DC | PRN

## 2021-03-21 MED ORDER — SENNOSIDES-DOCUSATE SODIUM 8.6-50 MG PO TABS: 1.0000 | ORAL_TABLET | Freq: Every evening | ORAL | 0 refills | Status: DC | PRN

## 2021-03-21 MED ORDER — ONDANSETRON 4 MG PO TBDP: 4.0000 mg | ORAL_TABLET | Freq: Three times a day (TID) | ORAL | 0 refills | Status: DC | PRN

## 2021-03-21 MED ORDER — OXYCODONE-ACETAMINOPHEN 5-325 MG PO TABS: 1.0000 | ORAL_TABLET | Freq: Four times a day (QID) | ORAL | 0 refills | Status: DC | PRN

## 2021-03-21 MED ORDER — KETOROLAC TROMETHAMINE 30 MG/ML IJ SOLN
30.0000 mg | Freq: Once | INTRAMUSCULAR | Status: AC
  Administered 2021-03-21: 30 mg via INTRAVENOUS
  Filled 2021-03-21: qty 1

## 2021-03-21 NOTE — Discharge Instructions (Addendum)
You were seen in the emergency department today with abdominal pain.  You have a cyst on your right ovary which I suspect is causing her discomfort but also some fluid collection in the uterus.  You will need to keep your appointment with OB/GYN.  Return to the emergency department new or suddenly worsening symptoms.  Do not take Percocet while driving or with other pain or anxiety medications.

## 2021-03-21 NOTE — ED Provider Notes (Signed)
Emergency Department Provider Note   I have reviewed the triage vital signs and the nursing notes.   HISTORY  Chief Complaint Abdominal Pain   HPI Priscilla [name] is a 25 y.o. female with past medical history reviewed below including recent ED visit with lower abdominal pain presents now with a diffuse abdominal pain in the lower abdomen worse in the right.  She states her prior ED visit she was having mainly left-sided pain had a CT showing a cyst on the right ovary.  Since she was not having pain on that side she decided to go home without obtaining a pelvic ultrasound.  She has since developed more diffuse lower abdominal pain with more severe pain now on the right side.  She tried taking the Vicodin prescribed but states it made her feel sick.  She has a follow-up appointment scheduled with OB/GYN on July 13 but did not feel she could make that appointment due to worsening symptoms.  Denies fevers or chills.  No chest pain or shortness of breath.  Past Medical History:  Diagnosis Date   Anemia    Headache    Infection    UTI    Patient Active Problem List   Diagnosis Date Noted   Vacuum-assisted vaginal delivery 05/22/2019   Indication for care in labor or delivery 05/21/2019   Bacterial vaginitis 06/02/2018   Normal delivery 12/22/2012   ALLERGIC RHINITIS 03/22/2006   STRABISMUS 02/25/2002    Past Surgical History:  Procedure Laterality Date   WISDOM TOOTH EXTRACTION      Allergies Chocolate and Amoxicillin  Family History  Problem Relation Age of Onset   Healthy Mother    Healthy Father    Asthma Sister    Diabetes Maternal Grandmother    COPD Neg Hx    Hypertension Neg Hx    Stroke Neg Hx     Social History Social History   Tobacco Use   Smoking status: Every Day    Packs/day: 0.25    Years: 1.00    Pack years: 0.25    Types: Cigarettes   Smokeless tobacco: Never  Vaping Use   Vaping Use: Never used  Substance Use Topics   Alcohol use: Not  Currently    Comment: occasionally   Drug use: Not Currently    Types: Marijuana    Review of Systems  Constitutional: No fever/chills Eyes: No visual changes. ENT: No sore throat. Cardiovascular: Denies chest pain. Respiratory: Denies shortness of breath. Gastrointestinal: Positive lower abdominal pain. Positive nausea, no vomiting.  No diarrhea.  No constipation. Genitourinary: Negative for dysuria. Musculoskeletal: Negative for back pain. Skin: Negative for rash. Neurological: Negative for headaches, focal weakness or numbness.  10-point ROS otherwise negative.  ____________________________________________   PHYSICAL EXAM:  VITAL SIGNS: ED Triage Vitals  Enc Vitals Group     BP 03/21/21 0306 136/74     Pulse Rate 03/21/21 0306 (!) 109     Resp 03/21/21 0306 20     Temp 03/21/21 0306 98.8 F (37.1 C)     Temp Source 03/21/21 0306 Oral     SpO2 03/21/21 0306 100 %   Constitutional: Alert and oriented. Well appearing and in no acute distress. Eyes: Conjunctivae are normal.  Head: Atraumatic. Nose: No congestion/rhinnorhea. Mouth/Throat: Mucous membranes are moist.   Neck: No stridor.   Cardiovascular: Normal rate, regular rhythm. Good peripheral circulation. Grossly normal heart sounds.   Respiratory: Normal respiratory effort.  No retractions. Lungs CTAB. Gastrointestinal: Soft with  mild tenderness in the lower abdomen, worse on the right. No distention.  Musculoskeletal: No lower extremity tenderness nor edema. No gross deformities of extremities. Neurologic:  Normal speech and language. No gross focal neurologic deficits are appreciated.  Skin:  Skin is warm, dry and intact. No rash noted.  ____________________________________________   LABS (all labs ordered are listed, but only abnormal results are displayed)  Labs Reviewed  COMPREHENSIVE METABOLIC PANEL - Abnormal; Notable for the following components:      Result Value   CO2 21 (*)    Glucose, Bld 110  (*)    Calcium 8.7 (*)    All other components within normal limits  CBC - Abnormal; Notable for the following components:   WBC 15.2 (*)    Hemoglobin 11.6 (*)    HCT 35.4 (*)    MCV 77.6 (*)    MCH 25.4 (*)    RDW 15.9 (*)    All other components within normal limits  URINALYSIS, ROUTINE W REFLEX MICROSCOPIC - Abnormal; Notable for the following components:   APPearance HAZY (*)    Hgb urine dipstick SMALL (*)    Leukocytes,Ua MODERATE (*)    All other components within normal limits  LIPASE, BLOOD  I-STAT BETA HCG BLOOD, ED ([facility], [facility], [facility] ONLY)   ____________________________________________  RADIOLOGY  US PELVIC COMPLETE W TRANSVAGINAL AND TORSION R/O  Result Date: 03/21/2021 CLINICAL DATA:  Lower abdominal pain. EXAM: TRANSABDOMINAL AND TRANSVAGINAL ULTRASOUND OF PELVIS DOPPLER ULTRASOUND OF OVARIES TECHNIQUE: Both transabdominal and transvaginal ultrasound examinations of the pelvis were performed. Transabdominal technique was performed for global imaging of the pelvis including uterus, ovaries, adnexal regions, and pelvic cul-de-sac. It was necessary to proceed with endovaginal exam following the transabdominal exam to visualize the uterus and ovary. Color and duplex Doppler ultrasound was utilized to evaluate blood flow to the ovaries. COMPARISON:  CT 03/17/2021. FINDINGS: Uterus Measurements: 9.2 x 5.6 x 6.2 cm = volume: 165.9 mL. No fibroids or other mass visualized. Endometrium Thickness: 5.7 mm. Complex intrauterine fluid collection noted. Pregnancy test suggested to exclude a gestational sac or pseudo sac. Other etiologies of intrauterine fluid collections cannot be excluded and follow-up exam suggested to demonstrate resolution. Right ovary Measurements: 6.3 x 4.1 x 4.7 cm = volume: 62.7 mL. 3.7 cm complex cyst possibly hemorrhagic cyst. Pregnancy test suggested to exclude ectopic pregnancy. A 2.9 cm adjacent hypoechoic focus is noted. This may be related to the 3.7 cm complex cyst.  Continued follow-up exam suggested to demonstrate resolution to exclude a focal persistent solid lesion. Left ovary Measurements: 4.3 x 1.8 x 3.1 cm = volume: 12.8 mL. Normal appearance/no adnexal mass. Pulsed Doppler evaluation of both ovaries demonstrates normal low-resistance arterial and venous waveforms. Other findings No abnormal free fluid. IMPRESSION: 1. Complex intrauterine fluid collection. Pregnancy test suggested to exclude a gestational sac or pseudo sac. 2. 3.7 cm complex cyst right ovary. This is possibly a hemorrhagic cyst. Pregnancy test suggested to exclude ectopic pregnancy. 3. An adjacent 2.9 cm hypoechoic focus is noted. This may be related to the complex cyst. Continued follow-up exam suggested to demonstrate resolution and to exclude a focal persistent solid lesion. Short-interval follow up ultrasound in 6-12 weeks is recommended, preferably during the week following the patient's normal menses. Electronically Signed   By: Marcello Moores  Register   On: 03/21/2021 08:17    ____________________________________________   PROCEDURES  Procedure(s) performed:   Procedures  None  ____________________________________________   INITIAL IMPRESSION / ASSESSMENT AND PLAN /  ED COURSE  Pertinent labs & imaging results that were available during my care of the patient were reviewed by me and considered in my medical decision making (see chart for details).   Patient arrives with now worsening lower abdominal pain described as somewhat worse on the right.  She has mild tenderness on exam.  Plan for ultrasound to better characterize this right ovarian cyst and specifically rule out torsion although my suspicion for this is very low.   08:25 AM  Ultrasound reviewed.  Patient has what appears to be a hemorrhagic cyst on the right.  Also with some complex fluid collection within the uterus.  The radiology read recommends obtaining a pregnancy test which was done prior to the ultrasound and negative  for pregnancy.  Patient has a OB/GYN follow-up appointment scheduled for the 13th.  We discussed the ultrasound results and the need to keep that appointment.  She has had significant improvement in her pain after Toradol.  Will discharge home with prescription for ibuprofen along with Percocet, Zofran, constipation medication. Discussed ED return precautions. No evidence of torsion on [place].  ____________________________________________  FINAL CLINICAL IMPRESSION(S) / ED DIAGNOSES  Final diagnoses:  Right lower quadrant abdominal pain  Cyst of right ovary     MEDICATIONS GIVEN DURING THIS VISIT:  Medications  ketorolac (TORADOL) 30 MG/ML injection 30 mg (30 mg Intravenous Given 03/21/21 0721)     NEW OUTPATIENT MEDICATIONS STARTED DURING THIS VISIT:  New Prescriptions   IBUPROFEN (ADVIL) 800 MG TABLET    Take 1 tablet (800 mg total) by mouth every 8 (eight) hours as needed for moderate pain.   ONDANSETRON (ZOFRAN ODT) 4 MG DISINTEGRATING TABLET    Take 1 tablet (4 mg total) by mouth every 8 (eight) hours as needed for nausea or vomiting.   OXYCODONE-ACETAMINOPHEN (PERCOCET/ROXICET) 5-325 MG TABLET    Take 1 tablet by mouth every 6 (six) hours as needed for severe pain.   SENNA-DOCUSATE (SENOKOT-S) 8.6-50 MG TABLET    Take 1 tablet by mouth at bedtime as needed for mild constipation.    Note:  This document was prepared using Dragon voice recognition software and may include unintentional dictation errors.  Nanda Quinton, MD, [facility] Emergency Medicine    Arnette Driggs, [facility], MD 03/21/21 [phone]

## 2021-03-21 NOTE — ED Triage Notes (Signed)
Pt reports that she has been having abd pain for the past week, reports seen here before and has a cyst on her R ovary, denies n/v/d

## 2021-03-21 NOTE — ED Notes (Signed)
Patient transported to Ultrasound 

## 2021-03-25 ENCOUNTER — Emergency Department (HOSPITAL_COMMUNITY): Payer: Medicaid Other

## 2021-03-25 ENCOUNTER — Other Ambulatory Visit: Payer: Self-pay

## 2021-03-25 ENCOUNTER — Emergency Department (HOSPITAL_COMMUNITY)
Admission: EM | Admit: 2021-03-25 | Discharge: 2021-03-25 | Disposition: A | Discharge status: Home / Self Care | Payer: Medicaid Other | Attending: Emergency Medicine | Admitting: Emergency Medicine

## 2021-03-25 ENCOUNTER — Encounter (HOSPITAL_COMMUNITY): Payer: Self-pay

## 2021-03-25 DIAGNOSIS — R1031 Right lower quadrant pain: Secondary | ICD-10-CM | POA: Diagnosis present

## 2021-03-25 DIAGNOSIS — F17210 Nicotine dependence, cigarettes, uncomplicated: Secondary | ICD-10-CM | POA: Diagnosis not present

## 2021-03-25 DIAGNOSIS — N898 Other specified noninflammatory disorders of vagina: Secondary | ICD-10-CM | POA: Insufficient documentation

## 2021-03-25 DIAGNOSIS — N83291 Other ovarian cyst, right side: Secondary | ICD-10-CM | POA: Insufficient documentation

## 2021-03-25 DIAGNOSIS — R000 Tachycardia, unspecified: Secondary | ICD-10-CM | POA: Insufficient documentation

## 2021-03-25 DIAGNOSIS — N83209 Unspecified ovarian cyst, unspecified side: Secondary | ICD-10-CM

## 2021-03-25 LAB — COMPREHENSIVE METABOLIC PANEL
ALT: 21 U/L (ref 0–44)
AST: 22 U/L (ref 15–41)
Albumin: 3.4 g/dL — ABNORMAL LOW (ref 3.5–5.0)
Alkaline Phosphatase: 62 U/L (ref 38–126)
Anion gap: 10 (ref 5–15)
BUN: 5 mg/dL — ABNORMAL LOW (ref 6–20)
CO2: 22 mmol/L (ref 22–32)
Calcium: 8.9 mg/dL (ref 8.9–10.3)
Chloride: 104 mmol/L (ref 98–111)
Creatinine, Ser: 0.68 mg/dL (ref 0.44–1.00)
GFR, Estimated: >60 mL/min (ref >60)
Glucose, Bld: 107 mg/dL — ABNORMAL HIGH (ref 70–99)
Potassium: 3.7 mmol/L (ref 3.5–5.1)
Sodium: 136 mmol/L (ref 135–145)
Total Bilirubin: 0.5 mg/dL (ref 0.3–1.2)
Total Protein: 7.2 g/dL (ref 6.5–8.1)

## 2021-03-25 LAB — URINALYSIS, ROUTINE W REFLEX MICROSCOPIC
Bilirubin Urine: NEGATIVE
Glucose, UA: NEGATIVE mg/dL
Ketones, ur: 20 mg/dL — AB
Leukocytes,Ua: NEGATIVE
Nitrite: NEGATIVE
Protein, ur: NEGATIVE mg/dL
Specific Gravity, Urine: 1.002 — ABNORMAL LOW (ref 1.005–1.030)
pH: 8 (ref 5.0–8.0)

## 2021-03-25 LAB — CBC WITH DIFFERENTIAL/PLATELET
Abs Immature Granulocytes: 0.04 10*3/uL (ref 0.00–0.07)
Basophils Absolute: 0.1 10*3/uL (ref 0.0–0.1)
Basophils Relative: 0 %
Eosinophils Absolute: 0.1 10*3/uL (ref 0.0–0.5)
Eosinophils Relative: 1 %
HCT: 37.1 % (ref 36.0–46.0)
Hemoglobin: 11.8 g/dL — ABNORMAL LOW (ref 12.0–15.0)
Immature Granulocytes: 0 %
Lymphocytes Relative: 15 %
Lymphs Abs: 1.8 10*3/uL (ref 0.7–4.0)
MCH: 24.9 pg — ABNORMAL LOW (ref 26.0–34.0)
MCHC: 31.8 g/dL (ref 30.0–36.0)
MCV: 78.4 fL — ABNORMAL LOW (ref 80.0–100.0)
Monocytes Absolute: 1 10*3/uL (ref 0.1–1.0)
Monocytes Relative: 8 %
Neutro Abs: 9.5 10*3/uL — ABNORMAL HIGH (ref 1.7–7.7)
Neutrophils Relative %: 76 %
Platelets: 392 10*3/uL (ref 150–400)
RBC: 4.73 MIL/uL (ref 3.87–5.11)
RDW: 15.3 % (ref 11.5–15.5)
WBC: 12.5 10*3/uL — ABNORMAL HIGH (ref 4.0–10.5)
nRBC: 0 % (ref 0.0–0.2)

## 2021-03-25 LAB — WET PREP, GENITAL
Clue Cells Wet Prep HPF POC: NONE SEEN
Sperm: NONE SEEN
Trich, Wet Prep: NONE SEEN
Yeast Wet Prep HPF POC: NONE SEEN

## 2021-03-25 LAB — LIPASE, BLOOD: Lipase: 22 U/L (ref 11–51)

## 2021-03-25 LAB — I-STAT BETA HCG BLOOD, ED (MC, WL, AP ONLY): I-stat hCG, quantitative: <5 m[IU]/mL (ref <5)

## 2021-03-25 MED ORDER — KETOROLAC TROMETHAMINE 30 MG/ML IJ SOLN
30.0000 mg | Freq: Once | INTRAMUSCULAR | Status: AC
  Administered 2021-03-25: 30 mg via INTRAVENOUS
  Filled 2021-03-25: qty 1

## 2021-03-25 MED ORDER — HYDROMORPHONE HCL 1 MG/ML IJ SOLN
0.5000 mg | Freq: Once | INTRAMUSCULAR | Status: AC
Start: 2021-03-25 — End: 2021-03-25
  Administered 2021-03-25: 0.5 mg via INTRAVENOUS
  Filled 2021-03-25: qty 1

## 2021-03-25 MED ORDER — ONDANSETRON HCL 4 MG/2ML IJ SOLN
4.0000 mg | Freq: Once | INTRAMUSCULAR | Status: AC
  Administered 2021-03-25: 4 mg via INTRAVENOUS
  Filled 2021-03-25: qty 2

## 2021-03-25 MED ORDER — TRAMADOL HCL 50 MG PO TABS: 50.0000 mg | ORAL_TABLET | Freq: Four times a day (QID) | ORAL | 0 refills | Status: DC | PRN

## 2021-03-25 MED ORDER — SODIUM CHLORIDE 0.9 % IV BOLUS
1000.0000 mL | Freq: Once | INTRAVENOUS | Status: AC
  Administered 2021-03-25: 1000 mL via INTRAVENOUS

## 2021-03-25 NOTE — ED Notes (Signed)
Return from US

## 2021-03-25 NOTE — ED Provider Notes (Signed)
[facility] EMERGENCY DEPARTMENT Provider Note   CSN: [id number] Arrival date & time: 03/25/21  0831     History Chief Complaint  Patient presents with   Abdominal Pain    Ovary on right cyst   Flank Pain    Right    Priscilla [name] is a 25 y.o. female.  25 year old female returns the ED with ongoing and worsening right lower quadrant abdominal pain.  Patient was seen initially in the emergency room on July 1 with left sided pain, incidental finding of right ovarian cyst, no explanation for her left-sided pain.  Patient was discharged with plan to follow-up with gynecology and is scheduled to be seen on July 13.  Patient return to the ED on July 5 with right lower quadrant pain, and had a pelvic ultrasound showing right ovarian cyst, negative for torsion, negative for pregnancy, discharged home with narcotic pain medication, ibuprofen and Zofran.  Patient states that the pain medication only makes her high and is not helping with her pain, states ibuprofen is not helping pain.  Pain radiates up towards her right flank, reports urinary frequency without hematuria or dysuria.  Denies vaginal discharge, nausea, vomiting, changes in bowel habits.  Reports that she does have loss of appetite.  Pain is constant, worse with movement, palpation, deep breaths.      Past Medical History:  Diagnosis Date   Anemia    Headache    Infection    UTI    Patient Active Problem List   Diagnosis Date Noted   Vacuum-assisted vaginal delivery 05/22/2019   Indication for care in labor or delivery 05/21/2019   Bacterial vaginitis 06/02/2018   Normal delivery 12/22/2012   ALLERGIC RHINITIS 03/22/2006   STRABISMUS 02/25/2002    Past Surgical History:  Procedure Laterality Date   WISDOM TOOTH EXTRACTION       OB History     Gravida  3   Para  2   Term  2   Preterm      AB  1   Living  2      SAB  1   IAB      Ectopic      Multiple  0   Live Births  2            Family History  Problem Relation Age of Onset   Healthy Mother    Healthy Father    Asthma Sister    Diabetes Maternal Grandmother    COPD Neg Hx    Hypertension Neg Hx    Stroke Neg Hx     Social History   Tobacco Use   Smoking status: Every Day    Packs/day: 0.25    Years: 1.00    Pack years: 0.25    Types: Cigarettes   Smokeless tobacco: Never  Vaping Use   Vaping Use: Never used  Substance Use Topics   Alcohol use: Not Currently    Comment: occasionally   Drug use: Not Currently    Types: Marijuana    Home Medications Prior to Admission medications   Medication Sig Start Date End Date Taking? Authorizing Provider  traMADol (ULTRAM) 50 MG tablet Take 1 tablet (50 mg total) by mouth every 6 (six) hours as needed. 03/25/21  Yes Tacy Learn, PA-C  diphenhydramine-acetaminophen (TYLENOL PM) 25-500 MG TABS tablet Take 1 tablet by mouth daily as needed (headache/pain).    [provider]  ibuprofen (ADVIL) 800 MG tablet Take 1  tablet (800 mg total) by mouth every 8 (eight) hours as needed for moderate pain. 03/21/21   Long, Wonda Olds, MD  ondansetron (ZOFRAN ODT) 4 MG disintegrating tablet Take 1 tablet (4 mg total) by mouth every 8 (eight) hours as needed for nausea or vomiting. 03/21/21   Long, Wonda Olds, MD  senna-docusate (SENOKOT-S) 8.6-50 MG tablet Take 1 tablet by mouth at bedtime as needed for mild constipation. 03/21/21   Long, Wonda Olds, MD    Allergies    Chocolate and Amoxicillin  Review of Systems   Review of Systems  Constitutional:  Positive for appetite change. Negative for chills, diaphoresis and fever.  Respiratory:  Negative for shortness of breath.   Cardiovascular:  Negative for chest pain.  Gastrointestinal:  Positive for abdominal pain. Negative for constipation, diarrhea, nausea and vomiting.  Genitourinary:  Positive for flank pain and frequency. Negative for dysuria and vaginal discharge.  Musculoskeletal:  Negative for back pain.   Skin:  Negative for rash and wound.  Allergic/Immunologic: Negative for immunocompromised state.  Neurological:  Negative for weakness.  Psychiatric/Behavioral:  Negative for confusion.   All other systems reviewed and are negative.  Physical Exam Updated Vital Signs BP 117/76 (BP Location: Right Arm)   Pulse (!) 105   Temp 99.3 F (37.4 C) (Oral)   Resp 16   Ht 5\' 1"  (1.549 m)   Wt 70.3 kg   LMP 03/22/2021   SpO2 100%   BMI 29.29 kg/m   Physical Exam Vitals and nursing note reviewed. Exam conducted with a chaperone present.  Constitutional:      General: She is not in acute distress.    Appearance: She is well-developed. She is obese. She is not diaphoretic.     Comments: Tearful  HENT:     Head: Normocephalic and atraumatic.  Cardiovascular:     Rate and Rhythm: Regular rhythm. Tachycardia present.     Heart sounds: Normal heart sounds.  Pulmonary:     Effort: Pulmonary effort is normal.     Breath sounds: Normal breath sounds.  Abdominal:     Palpations: Abdomen is soft.     Tenderness: There is generalized abdominal tenderness. There is no right CVA tenderness or left CVA tenderness.  Genitourinary:    Comments: Speculum exam shows moderate blood, unable to tolerate a manual exam- manual exam not done Skin:    General: Skin is warm and dry.     Coloration: Skin is not pale.     Findings: No rash.  Neurological:     Mental Status: She is alert and oriented to person, place, and time.  Psychiatric:        Behavior: Behavior normal.    ED Results / Procedures / Treatments   Labs (all labs ordered are listed, but only abnormal results are displayed) Labs Reviewed  WET PREP, GENITAL - Abnormal; Notable for the following components:      Result Value   WBC, Wet Prep HPF POC MODERATE (*)    All other components within normal limits  CBC WITH DIFFERENTIAL/PLATELET - Abnormal; Notable for the following components:   WBC 12.5 (*)    Hemoglobin 11.8 (*)    MCV  78.4 (*)    MCH 24.9 (*)    Neutro Abs 9.5 (*)    All other components within normal limits  COMPREHENSIVE METABOLIC PANEL - Abnormal; Notable for the following components:   Glucose, Bld 107 (*)    BUN 5 (*)  Albumin 3.4 (*)    All other components within normal limits  URINALYSIS, ROUTINE W REFLEX MICROSCOPIC - Abnormal; Notable for the following components:   Specific Gravity, Urine 1.002 (*)    Hgb urine dipstick LARGE (*)    Ketones, ur 20 (*)    Bacteria, UA RARE (*)    All other components within normal limits  LIPASE, BLOOD  I-STAT BETA HCG BLOOD, ED ([facility], [facility], [facility] ONLY)  GC/CHLAMYDIA PROBE AMP ([place]) NOT AT [facility]    EKG None  Radiology US Transvaginal Non-OB  Result Date: 03/25/2021 CLINICAL DATA:  Right lower quadrant pain. Known ovarian cyst. Worsening pain. EXAM: TRANSABDOMINAL AND TRANSVAGINAL ULTRASOUND OF PELVIS DOPPLER ULTRASOUND OF OVARIES TECHNIQUE: Both transabdominal and transvaginal ultrasound examinations of the pelvis were performed. Transabdominal technique was performed for global imaging of the pelvis including uterus, ovaries, adnexal regions, and pelvic cul-de-sac. It was necessary to proceed with endovaginal exam following the transabdominal exam to visualize the uterus, ovaries, and adnexa. Color and duplex Doppler ultrasound was utilized to evaluate blood flow to the ovaries. COMPARISON:  Pelvic ultrasound 03/21/2021. CT stone study of 03/17/2021. FINDINGS: Uterus Measurements: 9.0 x 4.8 x 5.5 cm = volume: 124 mL. No fibroids or other mass visualized. Endometrium Thickness: Normal, 8 mm.  No focal abnormality visualized. Right ovary Measurements: 4.2 x 2.6 x 2.9 cm = volume: 16.5 mL. 1.6 x 2.5 x 1.9 cm avascular right ovarian lesion with low-level internal echoes consistent with an evolving hemorrhagic cyst. Inferior to this, an avascular lesion with soft tissue echogenicity of 2.6 x 2.2 x 2.2 cm is also favored to represent a hemorrhagic cyst. Left ovary  Measurements: 3.3 x 2.0 x 2.4 cm = volume: 8.3 mL. No ovarian mass. An area of ill-defined soft tissue echogenicity adjacent the left ovary may represent minimal complex fluid. Pulsed Doppler evaluation of both ovaries demonstrates normal low-resistance arterial and venous waveforms. Other findings None IMPRESSION: 1. No evidence of ovarian or adnexal torsion. 2. Evolution of right ovarian hemorrhagic cysts. Electronically Signed   By: Abigail Miyamoto M.D.   On: 03/25/2021 11:49   US Pelvis Complete  Result Date: 03/25/2021 CLINICAL DATA:  Right lower quadrant pain. Known ovarian cyst. Worsening pain. EXAM: TRANSABDOMINAL AND TRANSVAGINAL ULTRASOUND OF PELVIS DOPPLER ULTRASOUND OF OVARIES TECHNIQUE: Both transabdominal and transvaginal ultrasound examinations of the pelvis were performed. Transabdominal technique was performed for global imaging of the pelvis including uterus, ovaries, adnexal regions, and pelvic cul-de-sac. It was necessary to proceed with endovaginal exam following the transabdominal exam to visualize the uterus, ovaries, and adnexa. Color and duplex Doppler ultrasound was utilized to evaluate blood flow to the ovaries. COMPARISON:  Pelvic ultrasound 03/21/2021. CT stone study of 03/17/2021. FINDINGS: Uterus Measurements: 9.0 x 4.8 x 5.5 cm = volume: 124 mL. No fibroids or other mass visualized. Endometrium Thickness: Normal, 8 mm.  No focal abnormality visualized. Right ovary Measurements: 4.2 x 2.6 x 2.9 cm = volume: 16.5 mL. 1.6 x 2.5 x 1.9 cm avascular right ovarian lesion with low-level internal echoes consistent with an evolving hemorrhagic cyst. Inferior to this, an avascular lesion with soft tissue echogenicity of 2.6 x 2.2 x 2.2 cm is also favored to represent a hemorrhagic cyst. Left ovary Measurements: 3.3 x 2.0 x 2.4 cm = volume: 8.3 mL. No ovarian mass. An area of ill-defined soft tissue echogenicity adjacent the left ovary may represent minimal complex fluid. Pulsed Doppler  evaluation of both ovaries demonstrates normal low-resistance arterial and venous waveforms. Other  findings None IMPRESSION: 1. No evidence of ovarian or adnexal torsion. 2. Evolution of right ovarian hemorrhagic cysts. Electronically Signed   By: Abigail Miyamoto M.D.   On: 03/25/2021 11:49   [place] Art/Ven Flow Abd Pelv Doppler  Result Date: 03/25/2021 CLINICAL DATA:  Right lower quadrant pain. Known ovarian cyst. Worsening pain. EXAM: TRANSABDOMINAL AND TRANSVAGINAL ULTRASOUND OF PELVIS DOPPLER ULTRASOUND OF OVARIES TECHNIQUE: Both transabdominal and transvaginal ultrasound examinations of the pelvis were performed. Transabdominal technique was performed for global imaging of the pelvis including uterus, ovaries, adnexal regions, and pelvic cul-de-sac. It was necessary to proceed with endovaginal exam following the transabdominal exam to visualize the uterus, ovaries, and adnexa. Color and duplex Doppler ultrasound was utilized to evaluate blood flow to the ovaries. COMPARISON:  Pelvic ultrasound 03/21/2021. CT stone study of 03/17/2021. FINDINGS: Uterus Measurements: 9.0 x 4.8 x 5.5 cm = volume: 124 mL. No fibroids or other mass visualized. Endometrium Thickness: Normal, 8 mm.  No focal abnormality visualized. Right ovary Measurements: 4.2 x 2.6 x 2.9 cm = volume: 16.5 mL. 1.6 x 2.5 x 1.9 cm avascular right ovarian lesion with low-level internal echoes consistent with an evolving hemorrhagic cyst. Inferior to this, an avascular lesion with soft tissue echogenicity of 2.6 x 2.2 x 2.2 cm is also favored to represent a hemorrhagic cyst. Left ovary Measurements: 3.3 x 2.0 x 2.4 cm = volume: 8.3 mL. No ovarian mass. An area of ill-defined soft tissue echogenicity adjacent the left ovary may represent minimal complex fluid. Pulsed Doppler evaluation of both ovaries demonstrates normal low-resistance arterial and venous waveforms. Other findings None IMPRESSION: 1. No evidence of ovarian or adnexal torsion. 2. Evolution of  right ovarian hemorrhagic cysts. Electronically Signed   By: Abigail Miyamoto M.D.   On: 03/25/2021 11:49    Procedures Pelvic exam  Date/Time: 03/25/2021 11:59 AM Performed by: Tacy Learn, PA-C Authorized by: Tacy Learn, PA-C  Consent: Verbal consent obtained. Risks and benefits: risks, benefits and alternatives were discussed Consent given by: patient Patient understanding: patient states understanding of the procedure being performed Patient identity confirmed: verbally with patient Local anesthesia used: no  Anesthesia: Local anesthesia used: no  Sedation: Patient sedated: no  Comments: Did not tolerate manual exam, exam was not done. Speculum exam shows blood in vagina, no dc otherwise     Medications Ordered in ED Medications  sodium chloride 0.9 % bolus 1,000 mL (0 mLs Intravenous Stopped 03/25/21 1118)  HYDROmorphone (DILAUDID) injection 0.5 mg (0.5 mg Intravenous Given 03/25/21 1216)  ondansetron (ZOFRAN) injection 4 mg (4 mg Intravenous Given 03/25/21 1213)  ketorolac (TORADOL) 30 MG/ML injection 30 mg (30 mg Intravenous Given 03/25/21 1214)    ED Course  I have reviewed the triage vital signs and the nursing notes.  Pertinent labs & imaging results that were available during my care of the patient were reviewed by me and considered in my medical decision making (see chart for details).  Clinical Course as of 03/25/21 1258  Sat Mar 26, 6651  2147 25 year old female returns to the ED with ongoing abdominal pain with recent diagnosis of hemorrhagic right ovarian cyst.  Pain not controlled with Percocet, Tylenol and Motrin at home. On exam, found to have generalized abdominal tenderness, no CVA tenderness. Labs reassuring including no significant change in hemoglobin, WBC.  CMP with normal LFTs.  Urinalysis with blood likely from vaginal bleeding, wet prep negative.  hCG negative. Pelvic ultrasound negative for torsion, does show persistent right ovarian hemorrhagic  cyst. Pain is controlled with half milligram of Dilaudid and Toradol.  Patient feels comfortable with discharge at this time to follow-up with her gynecologist.  Advised to discontinue her Percocet and Vicodin, will try tramadol as other measures have not helped her. [LM]    Clinical Course User Index [LM] Roque Lias   MDM Rules/Calculators/A&P                          Final Clinical Impression(s) / ED Diagnoses Final diagnoses:  Right lower quadrant abdominal pain  Hemorrhagic ovarian cyst    Rx / DC Orders ED Discharge Orders          Ordered    traMADol (ULTRAM) 50 MG tablet  Every 6 hours PRN        03/25/21 1252             Tacy Learn, PA-C 03/25/21 1258    Fredia Sorrow, MD 03/26/21 [phone]

## 2021-03-25 NOTE — Discharge Instructions (Addendum)
Discontinue Percocet and Vicodin.  Try tramadol for your pain. Follow-up with gynecology as discussed.

## 2021-03-25 NOTE — ED Triage Notes (Addendum)
Pt arrived to ED via POV w/ c/o ongoing RLQ abd pain. Pt was diagnosed w/ R ovary cyst and reports pain has not improved. Pt was given referral to OB and pt reports she scheduled a follow-up appt for the 13th. Pt is tearful and agitated upon triage assessment. Pt reports pain meds that she was prescribed at DC only get her high and aren't helping with the pain.

## 2021-03-25 NOTE — ED Notes (Signed)
Pt in ultrasound

## 2021-03-27 LAB — GC/CHLAMYDIA PROBE AMP (~~LOC~~) NOT AT ARMC
Chlamydia: POSITIVE — AB
Comment: NEGATIVE
Comment: NORMAL
Neisseria Gonorrhea: NEGATIVE

## 2021-03-29 ENCOUNTER — Ambulatory Visit: Payer: Medicaid Other | Admitting: Family Medicine

## 2021-05-25 ENCOUNTER — Emergency Department (HOSPITAL_COMMUNITY)
Admission: EM | Admit: 2021-05-25 | Discharge: 2021-05-25 | Disposition: A | Discharge status: Home / Self Care | Payer: Medicaid Other | Attending: Emergency Medicine | Admitting: Emergency Medicine

## 2021-05-25 ENCOUNTER — Other Ambulatory Visit: Payer: Self-pay

## 2021-05-25 DIAGNOSIS — U071 COVID-19: Secondary | ICD-10-CM | POA: Diagnosis not present

## 2021-05-25 DIAGNOSIS — Z5321 Procedure and treatment not carried out due to patient leaving prior to being seen by health care provider: Secondary | ICD-10-CM | POA: Insufficient documentation

## 2021-05-25 DIAGNOSIS — R42 Dizziness and giddiness: Secondary | ICD-10-CM | POA: Diagnosis present

## 2021-05-25 LAB — SARS CORONAVIRUS 2 (TAT 6-24 HRS): SARS Coronavirus 2: POSITIVE — AB

## 2021-05-25 MED ORDER — ACETAMINOPHEN 500 MG PO TABS
1000.0000 mg | ORAL_TABLET | Freq: Once | ORAL | Status: AC
  Administered 2021-05-25: 1000 mg via ORAL
  Filled 2021-05-25: qty 2

## 2021-05-25 NOTE — ED Triage Notes (Signed)
C/O feeling unwell and some cold symptoms,

## 2021-05-25 NOTE — ED Notes (Signed)
Pt eloped.

## 2021-05-25 NOTE — ED Provider Notes (Signed)
Emergency Medicine Provider Triage Evaluation Note  Priscilla [name] , a 25 y.o. female  was evaluated in triage.  Pt complains of flu like sx since last night. Pt's daughter and partner were both sick recently. Tried tylenol PM without relief.  Review of Systems  Positive: Fever, chills, headache, nausea, congestion Negative: CP, SOB, cough  Physical Exam  BP 123/78 (BP Location: Left Arm)   Pulse (!) 120   Temp (!) 102 F (38.9 C) (Oral)   Resp 18   SpO2 100%  Gen:   Awake, no distress   Resp:  Normal effort  MSK:   Moves extremities without difficulty  Other:  Tearful on exam  Medical Decision Making  Medically screening exam initiated at 7:02 PM.  Appropriate orders placed.  Priscilla [name] was informed that the remainder of the evaluation will be completed by another provider, this initial triage assessment does not replace that evaluation, and the importance of remaining in the ED until their evaluation is complete.     Estill Cotta 05/25/21 1903    Charlesetta Shanks, MD 05/25/21 2350

## 2021-12-12 ENCOUNTER — Other Ambulatory Visit: Payer: Self-pay

## 2021-12-12 ENCOUNTER — Encounter: Payer: Self-pay | Admitting: Internal Medicine

## 2021-12-12 ENCOUNTER — Ambulatory Visit: Payer: Medicaid Other | Admitting: Internal Medicine

## 2021-12-12 VITALS — BP 113/69 | HR 93 | Temp 98.6°F | Resp 20 | Ht 61.0 in | Wt 188.3 lb

## 2021-12-12 DIAGNOSIS — N83209 Unspecified ovarian cyst, unspecified side: Secondary | ICD-10-CM | POA: Diagnosis not present

## 2021-12-12 DIAGNOSIS — D171 Benign lipomatous neoplasm of skin and subcutaneous tissue of trunk: Secondary | ICD-10-CM | POA: Diagnosis not present

## 2021-12-12 DIAGNOSIS — Z0000 Encounter for general adult medical examination without abnormal findings: Secondary | ICD-10-CM

## 2021-12-12 DIAGNOSIS — G43909 Migraine, unspecified, not intractable, without status migrainosus: Secondary | ICD-10-CM | POA: Diagnosis present

## 2021-12-12 MED ORDER — RIZATRIPTAN BENZOATE 5 MG PO TABS: 10.0000 mg | ORAL_TABLET | Freq: Once | ORAL | 2 refills | Status: DC | PRN

## 2021-12-12 MED ORDER — TOPIRAMATE 25 MG PO TABS: 25.0000 mg | ORAL_TABLET | Freq: Every day | ORAL | 2 refills | Status: DC

## 2021-12-12 NOTE — Patient Instructions (Addendum)
Dear Mrs. Priscilla [name], ? ?Thank you for trusting [place] with your care today.  ? ?Please take the topamax daily for headache prevention. Pleas use the maxalt as needed when you do have the headaches. ?We have placed a referral to the OBGYN to monitor your ovarian cysts. They can also offer various forms of contraception, perform your PAP smears, and administer HPV shots.  ? ?We will keep an eye on the bump on your back. ? ? ?

## 2021-12-12 NOTE — Progress Notes (Signed)
? ?  CC: new to esttablish ? ?HPI:[name] is a 26 y.o. female who presents for evaluation of headache. Please see individual problem based A/P for details. ? ?Depression, PHQ-9: ?Based on the patients  score we have . ? ?Past Medical History:  ?Diagnosis Date  ? Anemia   ? Headache   ? Infection   ? UTI  ? ?Review of Systems:   ?Review of Systems  ?Constitutional:  Positive for malaise/fatigue.  ?HENT: Negative.    ?Respiratory: Negative.    ?Cardiovascular: Negative.   ?Musculoskeletal: Negative.   ?Neurological:  Positive for headaches.   ? ?Physical Exam: ?There were no vitals filed for this visit. ? ? ?General: AAOX3, NAD ?HEENT: Conjunctiva nl , antiicteric sclerae, moist mucous membranes, no exudate or erythema ?Cardiovascular: Normal rate, regular rhythm.  No murmurs, rubs, or gallops ?Pulmonary : Equal breath sounds, No wheezes, rales, or rhonchi ?Abdominal: soft, nontender,  bowel sounds present ?Ext: No edema in lower extremities, no tenderness to palpation of lower extremities.  ? ?Assessment & Plan:  ? ?See Encounters Tab for problem based charting. ? ?Patient discussed with Dr. Daryll Drown ? ?

## 2021-12-14 DIAGNOSIS — N83209 Unspecified ovarian cyst, unspecified side: Secondary | ICD-10-CM | POA: Insufficient documentation

## 2021-12-14 DIAGNOSIS — G43909 Migraine, unspecified, not intractable, without status migrainosus: Secondary | ICD-10-CM | POA: Insufficient documentation

## 2021-12-14 NOTE — Assessment & Plan Note (Addendum)
Patient reports chronic migraines since childhood. Describes headaches as pounding, typically left sided. Denies any sensory changes during these episodes, no tearing, and no nasal drainage.  She does report photophobia and states that during particularly bad migraines she will have nausea and vomiting. Reports typically has migraine nearly every day. She has been controlling them with Tylenol PM ?Medication overuse component may be contributing given frequency of symptoms and medication usage. However, symptoms are consistent with migraine without aura. Symptoms not consistent with tension, cluster, or sinus HA. We will start topiramate for prophylaxis. Maxalt for abortive therapy. ?

## 2021-12-15 ENCOUNTER — Encounter: Payer: Self-pay | Admitting: Internal Medicine

## 2021-12-15 DIAGNOSIS — Z0000 Encounter for general adult medical examination without abnormal findings: Secondary | ICD-10-CM | POA: Insufficient documentation

## 2021-12-15 DIAGNOSIS — D171 Benign lipomatous neoplasm of skin and subcutaneous tissue of trunk: Secondary | ICD-10-CM | POA: Insufficient documentation

## 2021-12-15 NOTE — Assessment & Plan Note (Signed)
Patient reporting mass on posterior back at the base of her neck between shoulder blades. States it has been present for some time. She does have some tiredness, menstrual irregularity and weight gain, but denies easy bruising. Furthermore, she does not have any other symptoms concerning for cushing's such as osteoporosis, HTN, or striae. No progression of symptoms.  ?Mass is soft, non-tender, no overlying skin changes. Approximately 2X2 inches and well circumscribed. Appears most consistent with lipoma. Will continue to monitor for now.  ?

## 2021-12-15 NOTE — Assessment & Plan Note (Addendum)
Patient does not follow with with OBGYN. She denies any current symptoms.  ?Cyst likely related to ovulatory dysfunction.  ?She would benefit from follow up with OBGYN for further monitoring of cyst. Additionally, she may benefit from initiation of contraceptive therapy, however, she is not interested in starting anything today. Will defer further conversations to OBGYN. ?

## 2021-12-15 NOTE — Assessment & Plan Note (Signed)
Patient overdue for HPV vaccine and PAP smear. She reports having had pap during recent evaluation for abdominal pain though I am unable to find record of this. It appears as though her last PAP was from 2020 during pregnancy. We are able to perform PAP in clinic. Alternatively, she can follow up with OBGYN to have this done. ?Additionally, we do not offer HPV shots through our clinic. Advised she follow up with OBGYN vs obtaining through pharmacy. ?

## 2022-01-02 NOTE — Progress Notes (Signed)
Internal Medicine Clinic Attending  Case discussed with Dr. Gawaluck  at the time of the visit.  We reviewed the resident's history and exam and pertinent patient test results.  I agree with the assessment, diagnosis, and plan of care documented in the resident's note.  

## 2022-03-27 ENCOUNTER — Encounter (HOSPITAL_COMMUNITY): Payer: Self-pay

## 2022-03-27 ENCOUNTER — Emergency Department (HOSPITAL_COMMUNITY)
Admission: EM | Admit: 2022-03-27 | Discharge: 2022-03-27 | Discharge status: Against Medical Advice | Payer: Medicaid Other | Attending: Emergency Medicine | Admitting: Emergency Medicine

## 2022-03-27 ENCOUNTER — Other Ambulatory Visit: Payer: Self-pay

## 2022-03-27 DIAGNOSIS — Z5321 Procedure and treatment not carried out due to patient leaving prior to being seen by health care provider: Secondary | ICD-10-CM | POA: Insufficient documentation

## 2022-03-27 DIAGNOSIS — Z3200 Encounter for pregnancy test, result unknown: Secondary | ICD-10-CM | POA: Insufficient documentation

## 2022-03-27 LAB — PREGNANCY, URINE: Preg Test, Ur: POSITIVE — AB

## 2022-03-27 NOTE — ED Triage Notes (Addendum)
Pt reports she has not had a menstrual period in 2 months and thinks she could be pregnant. She states she did not take a home test because "they dont work for me. My last two pregnancies the home tests were negative but when I go to the doctor's office they were positive."

## 2022-03-27 NOTE — ED Notes (Signed)
Pt called for room, no answer.

## 2022-04-04 ENCOUNTER — Ambulatory Visit (INDEPENDENT_AMBULATORY_CARE_PROVIDER_SITE_OTHER): Payer: Medicaid Other

## 2022-04-04 VITALS — BP 131/72 | HR 98 | Ht 61.0 in | Wt 189.6 lb

## 2022-04-04 DIAGNOSIS — O3680X0 Pregnancy with inconclusive fetal viability, not applicable or unspecified: Secondary | ICD-10-CM

## 2022-04-04 DIAGNOSIS — Z3480 Encounter for supervision of other normal pregnancy, unspecified trimester: Secondary | ICD-10-CM

## 2022-04-04 DIAGNOSIS — Z3A01 Less than 8 weeks gestation of pregnancy: Secondary | ICD-10-CM | POA: Diagnosis not present

## 2022-04-04 MED ORDER — GOJJI WEIGHT SCALE MISC: 1.0000 | 0 refills | Status: DC

## 2022-04-04 MED ORDER — BLOOD PRESSURE KIT DEVI: 1.0000 | 0 refills | Status: DC

## 2022-04-04 MED ORDER — VITAFOL-ONE 29-1-200 MG PO CAPS: 1.0000 | ORAL_CAPSULE | Freq: Every day | ORAL | 11 refills | Status: DC

## 2022-04-04 NOTE — Progress Notes (Cosign Needed)
New OB Intake  I connected with  Priscilla [name] on 04/04/22 at  3:10 PM EDT by in person and verified that I am speaking with the correct person using two identifiers. Nurse is located at [facility] and pt is located at [place].  I discussed the limitations, risks, security and privacy concerns of performing an evaluation and management service by telephone and the availability of in person appointments. I also discussed with the patient that there may be a patient responsible charge related to this service. The patient expressed understanding and agreed to proceed.  I explained I am completing New OB Intake today. We discussed her EDD of TBD. U/S today reveals GS only, no yolk sac or fetal pole seen. Patient to be scheduled for a follow up scan in about 14 days. Patient state that she had 1 days of spotting on 6/13 which she was unsure if it was her cycle due to shorter length. Pt is G4/P2012. I reviewed her allergies, medications, Medical/Surgical/OB history, and appropriate screenings. I informed her of [facility] services. [facility] information placed in AVS. Based on history, this is a/an  pregnancy uncomplicated .   Patient Active Problem List   Diagnosis Date Noted   Healthcare maintenance 12/15/2021   Lipoma of back 12/15/2021   Hemorrhagic cyst of ovary 12/14/2021   Migraines 12/14/2021   Vacuum-assisted vaginal delivery 05/22/2019   Indication for care in labor or delivery 05/21/2019   Bacterial vaginitis 06/02/2018   Normal delivery 12/22/2012   ALLERGIC RHINITIS 03/22/2006   STRABISMUS 02/25/2002    Concerns addressed today  Delivery Plans Plans to deliver at [facility]. Patient given information for [facility] Healthy Baby website for more information about [facility] and [place]. Patient is not interested in water birth. Offered upcoming OB visit with CNM to discuss further.  MyChart/Babyscripts MyChart access verified. I explained pt will have some visits in office and some virtually.  Babyscripts instructions given and order placed. Patient verifies receipt of registration text/e-mail. Account successfully created and app downloaded.  Blood Pressure Cuff/Weight Scale Blood pressure cuff ordered for patient to pick-up from [employer]. Explained after first prenatal appt pt will check weekly and document in 52. Patient does / does not  have weight scale. Weight scale ordered for patient to pick up from [employer].   Anatomy US Explained first scheduled Korea will be around 19 weeks. Dating and viability scan performed today. Anatomy scan to be scheduled at her New OB.  Labs Discussed Johnsie Cancel genetic screening with patient. Would like both Panorama and Horizon drawn at new OB visit. Routine prenatal labs needed.  Covid Vaccine Patient has a partial covid vaccine.   Is patient a CenteringPregnancy candidate?  Declined Declined due to Group Setting Not a candidate due to  Centering Patient" indicated on sticky note   Social Determinants of Health Food Insecurity: Patient denies food insecurity. WIC Referral: Patient is interested in referral to [facility].  Transportation: Patient denies transportation needs. Childcare: Discussed no children allowed at ultrasound appointments. Offered childcare services; patient expresses need for childcare services. Childcare scheduled for appropriate appointments and information given to patient.  First visit review I reviewed new OB appt with pt. I explained she will have a provider visit that includes prenatal labs, pap smear, and discuss plan of care for pregnancy. Explained pt will be seen by Lynnda Shields at first visit; encounter routed to appropriate provider. Explained that patient will be seen by pregnancy navigator following visit with provider.  Priscilla Lei, RN 04/04/2022  3:07 PM

## 2022-04-10 NOTE — Progress Notes (Signed)
Patient was assessed and managed by nursing staff during this encounter. I have reviewed the chart and agree with the documentation and plan. I have also made any necessary editorial changes.  Emeterio Reeve, MD 04/10/2022 2:40 PM

## 2022-04-18 ENCOUNTER — Ambulatory Visit
Admission: RE | Admit: 2022-04-18 | Discharge: 2022-04-18 | Disposition: A | Discharge status: Home / Self Care | Payer: Medicaid Other | Source: Ambulatory Visit | Attending: Obstetrics & Gynecology | Admitting: Obstetrics & Gynecology

## 2022-04-18 ENCOUNTER — Ambulatory Visit (INDEPENDENT_AMBULATORY_CARE_PROVIDER_SITE_OTHER): Payer: Medicaid Other | Admitting: Family Medicine

## 2022-04-18 ENCOUNTER — Encounter: Payer: Self-pay | Admitting: Family Medicine

## 2022-04-18 VITALS — BP 123/70 | HR 81 | Wt 186.6 lb

## 2022-04-18 DIAGNOSIS — O3680X0 Pregnancy with inconclusive fetal viability, not applicable or unspecified: Secondary | ICD-10-CM | POA: Insufficient documentation

## 2022-04-18 DIAGNOSIS — O039 Complete or unspecified spontaneous abortion without complication: Secondary | ICD-10-CM | POA: Diagnosis not present

## 2022-04-18 MED ORDER — MISOPROSTOL 200 MCG PO TABS: 800.0000 ug | ORAL_TABLET | Freq: Once | ORAL | 0 refills | Status: DC

## 2022-04-18 MED ORDER — ONDANSETRON 8 MG PO TBDP: 8.0000 mg | ORAL_TABLET | Freq: Three times a day (TID) | ORAL | 0 refills | Status: DC | PRN

## 2022-04-18 MED ORDER — OXYCODONE HCL 5 MG PO TABS: 5.0000 mg | ORAL_TABLET | ORAL | 0 refills | Status: DC | PRN

## 2022-04-18 NOTE — Progress Notes (Signed)
GYNECOLOGY OFFICE VISIT NOTE  History:   Priscilla [name] is a 26 y.o. [phone] here today for [place] results.  Had Korea on 04/04/2022 that showed IUGS and no other structures Korea today shows essentially the same findings   Health Maintenance Due  Topic Date Due   COVID-19 Vaccine (1) Never done   HPV VACCINES (1 - 2-dose series) Never done   Hepatitis C Screening  Never done   PAP-Cervical Cytology Screening  Never done   PAP SMEAR-Modifier  Never done   CHLAMYDIA SCREENING  03/25/2022   INFLUENZA VACCINE  04/17/2022    Past Medical History:  Diagnosis Date   Anemia    Headache    Infection    UTI    Past Surgical History:  Procedure Laterality Date   WISDOM TOOTH EXTRACTION      The following portions of the patient's history were reviewed and updated as appropriate: allergies, current medications, past family history, past medical history, past social history, past surgical history and problem list.   Health Maintenance:   Last pap: No results found for: "DIAGPAP", "HPV", "[place]"   Last mammogram:  N/a    Review of Systems:  Pertinent items noted in HPI and remainder of comprehensive ROS otherwise negative.  Physical Exam:  BP 123/70   Pulse 81   Wt 186 lb 9.6 oz (84.6 kg)   LMP 01/25/2022 (Approximate)   BMI 35.26 kg/m  CONSTITUTIONAL: Well-developed, well-nourished female in no acute distress.  HEENT:  Normocephalic, atraumatic. External right and left ear normal. No scleral icterus.  NECK: Normal range of motion, supple, no masses noted on observation SKIN: No rash noted. Not diaphoretic. No erythema. No pallor. MUSCULOSKELETAL: Normal range of motion. No edema noted. NEUROLOGIC: Alert and oriented to person, place, and time. Normal muscle tone coordination.  PSYCHIATRIC: Sad mood and affect. Normal behavior. Normal judgment and thought content. RESPIRATORY: Effort normal, no problems with respiration noted  Labs and Imaging No results found for this  or any previous visit (from the past 168 hour(s)). US OB LESS THAN 14 WEEKS WITH OB TRANSVAGINAL  Result Date: 04/18/2022 CLINICAL DATA:  Encounter to determine fetal viability of pregnancy, single or unspecified fetus. EXAM: OBSTETRIC <14 WK [place] AND TRANSVAGINAL OB US TECHNIQUE: Both transabdominal and transvaginal ultrasound examinations were performed for complete evaluation of the gestation as well as the maternal uterus, adnexal regions, and pelvic cul-de-sac. Transvaginal technique was performed to assess early pregnancy. COMPARISON:  Obstetric ultrasound 04/04/2022 FINDINGS: Intrauterine gestational sac: Single Yolk sac:  Not Visualized. Embryo:  Not Visualized. MSD: 16.1 mm   6 w   3 d Subchorionic hemorrhage:  None visualized. Maternal uterus/adnexae: No suspicious adnexal mass or significant pelvic free fluid. IMPRESSION: Intrauterine gestational sac with persistent lack of a visible yolk sac or embryo. Findings meet definitive criteria for failed pregnancy. This follows SRU consensus guidelines: Diagnostic Criteria for Nonviable Pregnancy Early in the First Trimester. Alison Stalling J Med [phone]. Electronically Signed   By: Logan Bores M.D.   On: 04/18/2022 11:25   US OB Limited  Result Date: 04/16/2022 ----------------------------------------------------------------------  OBSTETRICS REPORT                        (Signed Final 04/16/2022 10:12 am) ---------------------------------------------------------------------- Patient Info  ID #:       [id number]  D.O.B.:  [birth year] (26 yrs)  Name:       Priscilla [name]                 Visit Date: 04/04/2022 04:00 pm ---------------------------------------------------------------------- Performed By  Attending:        Emeterio Reeve MD        Ref. Address:      1st floor                                                              [address]                                                              [place], [place]  Performed By:     Elyn Peers RN         [facility]          [facility]                                                              [facility]  Referred By:      Woodroe Mode                    MD ---------------------------------------------------------------------- Orders  #  Description                           Code        Ordered By  1  US OB LIMITED                         54650.3     Emeterio Reeve ----------------------------------------------------------------------  #  Order #  Accession #                Episode #  1  [id number]                   [id number]                 [id number] ---------------------------------------------------------------------- Indications  Less than [redacted] weeks gestation of pregnancy        Z3A.01 ---------------------------------------------------------------------- Fetal Evaluation  Num Of Fetuses:          1  Gest. Sac:               Intrauterine  Yolk Sac:                Not visualized  Cardiac Activity:        No embryo visualized ---------------------------------------------------------------------- Biometry  GS:       11.6  mm     G. Age:  6w 0d                   EDD:    11/28/22 ---------------------------------------------------------------------- Gestational Age  Best:          6w 0d      Det. By:  U/S G S (04/04/22)       EDD:   11/28/22 ---------------------------------------------------------------------- Comments  Intrauterine GS measuring [redacted]w[redacted]d No yolk sac or fetal pole  seen. Patient states that she had one day of bleeding in June  on 6/13 which would be 510w1dPatient advised that she will  need a follow up scan in about 2 weeks. ---------------------------------------------------------------------- Impression  Intrauterine gestational sac without fetal pole or yolk  sac ---------------------------------------------------------------------- Recommendations  Repeat ultrasound in  10-14 days ----------------------------------------------------------------------                  JaEmeterio ReeveMD Electronically Signed Final Report   04/16/2022 10:12 am ----------------------------------------------------------------------     Assessment and Plan:   Problem List Items Addressed This Visit       Other   Miscarriage - Primary    Reviewed images personally, confirmed they are consistent with definitive findings of non viable pregnancy.  Reassured patient that miscarriage is common with ~1/4 of women experiencing it in their lifetime. Reassured patient that there is nothing she did or did not do to cause this. Reviewed most common reason is presumed to be genetic abnormalities that allow a pregnancy to start but not continue past an early stage, but realistically we do not know the cause in most cases. Reviewed options of expectant, medical, or surgical management. After counseling she elected for medical management. Rx sent for misoprostol 800 mcg buccal, zofran ODT, and 4 tabs of '5mg'$  Oxycodone. Reviewed that cramping, bleeding are normal in the first few hours after taking the medication, but should eventually wane. Reviewed warning signs of heavy vaginal bleeding soaking through >1 pad per hour, crescendo abdominal pain, and fever. . Blood type B positive, rhogam was not indicated. Patient declined contraception.       Relevant Medications   misoprostol (CYTOTEC) 200 MCG tablet   ondansetron (ZOFRAN-ODT) 8 MG disintegrating tablet   oxyCODONE (ROXICODONE) 5 MG immediate release tablet    Routine preventative health maintenance measures emphasized. Please refer to After Visit Summary for other counseling recommendations.   Return if symptoms worsen or fail to improve.    Total face-to-face time with patient: 15 minutes.  Over 50% of encounter was spent on  counseling and coordination  of care.   Clarnce Flock, MD/MPH Attending Family Medicine Physician, [facility], [place]

## 2022-04-18 NOTE — Assessment & Plan Note (Addendum)
Reviewed images personally, confirmed they are consistent with definitive findings of non viable pregnancy.  Reassured patient that miscarriage is common with ~1/4 of women experiencing it in their lifetime. Reassured patient that there is nothing she did or did not do to cause this. Reviewed most common reason is presumed to be genetic abnormalities that allow a pregnancy to start but not continue past an early stage, but realistically we do not know the cause in most cases. Reviewed options of expectant, medical, or surgical management. After counseling she elected for medical management. Rx sent for misoprostol 800 mcg buccal, zofran ODT, and 4 tabs of '5mg'$  Oxycodone. Reviewed that cramping, bleeding are normal in the first few hours after taking the medication, but should eventually wane. Reviewed warning signs of heavy vaginal bleeding soaking through >1 pad per hour, crescendo abdominal pain, and fever. . Blood type B positive, rhogam was not indicated. Patient declined contraception.

## 2022-04-23 ENCOUNTER — Other Ambulatory Visit: Payer: Self-pay

## 2022-04-23 ENCOUNTER — Encounter (HOSPITAL_COMMUNITY): Payer: Self-pay | Admitting: *Deleted

## 2022-04-23 ENCOUNTER — Inpatient Hospital Stay (HOSPITAL_COMMUNITY): Payer: Medicaid Other

## 2022-04-23 ENCOUNTER — Ambulatory Visit (HOSPITAL_COMMUNITY)
Admission: EM | Admit: 2022-04-23 | Discharge: 2022-04-23 | Disposition: A | Discharge status: Home / Self Care | Payer: Medicaid Other

## 2022-04-23 ENCOUNTER — Inpatient Hospital Stay (HOSPITAL_COMMUNITY)
Admission: AD | Admit: 2022-04-23 | Discharge: 2022-04-23 | Disposition: A | Discharge status: Home / Self Care | Payer: Medicaid Other | Attending: Family Medicine | Admitting: Family Medicine

## 2022-04-23 ENCOUNTER — Encounter (HOSPITAL_COMMUNITY): Payer: Self-pay | Admitting: Family Medicine

## 2022-04-23 DIAGNOSIS — O0380 Unspecified complication following complete or unspecified spontaneous abortion: Secondary | ICD-10-CM

## 2022-04-23 DIAGNOSIS — Z3480 Encounter for supervision of other normal pregnancy, unspecified trimester: Secondary | ICD-10-CM

## 2022-04-23 DIAGNOSIS — O039 Complete or unspecified spontaneous abortion without complication: Secondary | ICD-10-CM | POA: Diagnosis present

## 2022-04-23 DIAGNOSIS — R109 Unspecified abdominal pain: Secondary | ICD-10-CM

## 2022-04-23 LAB — CBC WITH DIFFERENTIAL/PLATELET
Abs Immature Granulocytes: 0.04 10*3/uL (ref 0.00–0.07)
Basophils Absolute: 0.1 10*3/uL (ref 0.0–0.1)
Basophils Relative: 0 %
Eosinophils Absolute: 0.2 10*3/uL (ref 0.0–0.5)
Eosinophils Relative: 1 %
HCT: 35.3 % — ABNORMAL LOW (ref 36.0–46.0)
Hemoglobin: 11.8 g/dL — ABNORMAL LOW (ref 12.0–15.0)
Immature Granulocytes: 0 %
Lymphocytes Relative: 16 %
Lymphs Abs: 2.2 10*3/uL (ref 0.7–4.0)
MCH: 27.3 pg (ref 26.0–34.0)
MCHC: 33.4 g/dL (ref 30.0–36.0)
MCV: 81.7 fL (ref 80.0–100.0)
Monocytes Absolute: 0.8 10*3/uL (ref 0.1–1.0)
Monocytes Relative: 6 %
Neutro Abs: 10.3 10*3/uL — ABNORMAL HIGH (ref 1.7–7.7)
Neutrophils Relative %: 77 %
Platelets: 303 10*3/uL (ref 150–400)
RBC: 4.32 MIL/uL (ref 3.87–5.11)
RDW: 15 % (ref 11.5–15.5)
WBC: 13.6 10*3/uL — ABNORMAL HIGH (ref 4.0–10.5)
nRBC: 0 % (ref 0.0–0.2)

## 2022-04-23 LAB — URINALYSIS, ROUTINE W REFLEX MICROSCOPIC
Bilirubin Urine: NEGATIVE
Glucose, UA: NEGATIVE mg/dL
Ketones, ur: NEGATIVE mg/dL
Leukocytes,Ua: NEGATIVE
Nitrite: NEGATIVE
Protein, ur: NEGATIVE mg/dL
Specific Gravity, Urine: 1.01 (ref 1.005–1.030)
pH: 5 (ref 5.0–8.0)

## 2022-04-23 LAB — HCG, QUANTITATIVE, PREGNANCY: hCG, Beta Chain, Quant, S: 441 m[IU]/mL — ABNORMAL HIGH (ref <5)

## 2022-04-23 MED ORDER — OXYCODONE-ACETAMINOPHEN 5-325 MG PO TABS
1.0000 | ORAL_TABLET | Freq: Once | ORAL | Status: AC
  Administered 2022-04-23: 1 via ORAL
  Filled 2022-04-23: qty 1

## 2022-04-23 MED ORDER — DICLOFENAC SODIUM 75 MG PO TBEC: 75.0000 mg | DELAYED_RELEASE_TABLET | Freq: Two times a day (BID) | ORAL | 0 refills | Status: DC | PRN

## 2022-04-23 NOTE — MAU Note (Addendum)
[name] is a 26 y.o. at 34w4dhere in MAU reporting: she's having continues VB and abdominal cramping since taking 4 pills to terminate pregnancy secondary miscarriage.  Reports changing sanitary napkin every 4 hours. LMP: N/A Onset of complaint: 2 days Pain score: 10 There were no vitals filed for this visit.   FHT:N/A Lab orders placed from triage:   None

## 2022-04-23 NOTE — Discharge Instructions (Addendum)
Discharged to MAU via personal vehicle

## 2022-04-23 NOTE — MAU Provider Note (Signed)
History     CSN: [id number]  Arrival date and time: 04/23/22 1040    Chief Complaint  Patient presents with   Vaginal Bleeding   Abdominal Pain   HPI This is a 26 year old G4 P2-0-2-2 who is 12 weeks 4 days by LMP.  She was diagnosed with a 6-week 3-day missed AB with an empty intrauterine gestational sac.  She was given Cytotec and 2 tablets of oxycodone.  She took the Cytotec on Friday.  She has had some mild bleeding with 2 small blood clots, but has not passed any tissue.  She is having a fair amount of lower pelvic cramping, but feels that she has not passed any of the pregnancy tissue.  No pitting or provoking factors.  She has been trying to take ibuprofen and Tylenol, which have only been mildly helpful.  OB History     Gravida  4   Para  2   Term  2   Preterm      AB  2   Living  2      SAB  2   IAB      Ectopic      Multiple  0   Live Births  2           Past Medical History:  Diagnosis Date   Anemia    Headache    Infection    UTI    Past Surgical History:  Procedure Laterality Date   WISDOM TOOTH EXTRACTION      Family History  Problem Relation Age of Onset   Colon cancer Mother    Healthy Father    Asthma Sister    Diabetes Maternal Grandmother    COPD Neg Hx    Hypertension Neg Hx    Stroke Neg Hx     Social History   Tobacco Use   Smoking status: Every Day    Packs/day: 0.25    Years: 1.00    Total pack years: 0.25    Types: Cigarettes   Smokeless tobacco: Never   Tobacco comments:    2-3 cigs per day  Vaping Use   Vaping Use: Never used  Substance Use Topics   Alcohol use: Not Currently    Comment: occasionally, prior to pregnancy   Drug use: Yes    Types: Marijuana    Comment: daily, 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      Blood Pressure Monitoring (BLOOD PRESSURE KIT) DEVI 1 kit by Does not apply route once a week. 1 each 0    diphenhydramine-acetaminophen (TYLENOL PM) 25-500 MG TABS tablet Take 1 tablet by mouth daily as needed (headache/pain).      ibuprofen (ADVIL) 800 MG tablet Take 1 tablet (800 mg total) by mouth every 8 (eight) hours as needed for moderate pain. (Patient not taking: Reported on 04/04/2022) 21 tablet 0    Misc. Devices (GOJJI WEIGHT SCALE) MISC 1 Device by Does not apply route every 30 (thirty) days. 1 each 0    misoprostol (CYTOTEC) 200 MCG tablet Take 4 tablets (800 mcg total) by mouth once for 1  dose. 4 tablet 0    ondansetron (ZOFRAN-ODT) 8 MG disintegrating tablet Take 1 tablet (8 mg total) by mouth every 8 (eight) hours as needed for nausea or vomiting. 20 tablet 0    oxyCODONE (ROXICODONE) 5 MG immediate release tablet Take 1 tablet (5 mg total) by mouth every 4 (four) hours as needed for severe pain. 2 tablet 0    Prenatal Vit-FePoly-FA-DHA (VITAFOL-ONE) 29-1-200 MG CAPS Take 1 capsule by mouth daily. 30 capsule 11    rizatriptan (MAXALT) 5 MG tablet Take 2 tablets (10 mg total) by mouth once as needed for migraine. May repeat in 2 hours if needed (Patient not taking: Reported on 04/04/2022) 30 tablet 2    senna-docusate (SENOKOT-S) 8.6-50 MG tablet Take 1 tablet by mouth at bedtime as needed for mild constipation. 20 tablet 0    topiramate (TOPAMAX) 25 MG tablet Take 1 tablet (25 mg total) by mouth at bedtime. (Patient not taking: Reported on 04/04/2022) 30 tablet 2    traMADol (ULTRAM) 50 MG tablet Take 1 tablet (50 mg total) by mouth every 6 (six) hours as needed. 12 tablet 0     Review of Systems Physical Exam   Blood pressure 126/74, pulse 89, temperature 98.5 F (36.9 C), temperature source Oral, resp. rate 18, height 5' 1"  (1.549  m), weight 88 kg, SpO2 98 %, unknown if currently breastfeeding.  Physical Exam Vitals reviewed.  Constitutional:      Appearance: She is well-developed.  HENT:     Head: Normocephalic and atraumatic.  Cardiovascular:     Rate and Rhythm: Normal rate and regular rhythm.  Abdominal:     General: Abdomen is flat.     Tenderness: There is abdominal tenderness in the right lower quadrant and left lower quadrant. There is no guarding or rebound.  Skin:    General: Skin is warm and dry.     Capillary Refill: Capillary refill takes less than 2 seconds.  Neurological:     General: No focal deficit present.     Mental Status: She is alert.    Results for orders placed or performed during the hospital encounter of 04/23/22 (from the past 24 hour(s))  CBC with Differential/Platelet     Status: Abnormal   Collection Time: 04/23/22 11:30 AM  Result Value Ref Range   WBC 13.6 (H) 4.0 - 10.5 K/uL   RBC 4.32 3.87 - 5.11 MIL/uL   Hemoglobin 11.8 (L) 12.0 - 15.0 g/dL   HCT 35.3 (L) 36.0 - 46.0 %   MCV 81.7 80.0 - 100.0 fL   MCH 27.3 26.0 - 34.0 pg   MCHC 33.4 30.0 - 36.0 g/dL   RDW 15.0 11.5 - 15.5 %   Platelets 303 150 - 400 K/uL   nRBC 0.0 0.0 - 0.2 %   Neutrophils Relative % 77 %   Neutro Abs 10.3 (H) 1.7 - 7.7 K/uL   Lymphocytes Relative 16 %   Lymphs Abs 2.2 0.7 - 4.0 K/uL   Monocytes Relative 6 %   Monocytes Absolute 0.8 0.1 - 1.0 K/uL   Eosinophils Relative 1 %   Eosinophils Absolute 0.2 0.0 - 0.5 K/uL   Basophils Relative 0 %   Basophils Absolute 0.1 0.0 - 0.1 K/uL   Immature Granulocytes 0 %   Abs Immature Granulocytes 0.04 0.00 - 0.07 K/uL  hCG, quantitative, pregnancy     Status: Abnormal   Collection Time: 04/23/22 11:30 AM  Result Value Ref Range   hCG, Beta Chain,  Quant, S 441 (H) <5 mIU/mL  Urinalysis, Routine w reflex microscopic Urine, Clean Catch     Status: Abnormal   Collection Time: 04/23/22  1:31 PM  Result Value Ref Range   Color, Urine YELLOW YELLOW    APPearance CLEAR CLEAR   Specific Gravity, Urine 1.010 1.005 - 1.030   pH 5.0 5.0 - 8.0   Glucose, UA NEGATIVE NEGATIVE mg/dL   Hgb urine dipstick LARGE (A) NEGATIVE   Bilirubin Urine NEGATIVE NEGATIVE   Ketones, ur NEGATIVE NEGATIVE mg/dL   Protein, ur NEGATIVE NEGATIVE mg/dL   Nitrite NEGATIVE NEGATIVE   Leukocytes,Ua NEGATIVE NEGATIVE   RBC / HPF 21-50 0 - 5 RBC/hpf   WBC, UA 6-10 0 - 5 WBC/hpf   Bacteria, UA RARE (A) NONE SEEN   Squamous Epithelial / LPF 0-5 0 - 5   Mucus PRESENT    US OB Transvaginal  Result Date: 04/23/2022 CLINICAL DATA:  Vaginal bleeding EXAM: TRANSVAGINAL OB ULTRASOUND TECHNIQUE: Transvaginal ultrasound was performed for complete evaluation of the gestation as well as the maternal uterus, adnexal regions, and pelvic cul-de-sac. COMPARISON:  04/18/2022 FINDINGS: Intrauterine gestational sac: None. Gestational sac seen in the previous examination is not evident in the current study. Yolk sac:  Not seen Embryo:  Not seen Cardiac Activity: Not seen Subchorionic hemorrhage:  None visualized. Maternal uterus/adnexae: Unremarkable. There is no free fluid in pelvis. IMPRESSION: There is no demonstrable intrauterine gestational sac. Findings suggest possible failed gestation and completion of abortion. If pregnancy test is positive, other diagnostic possibilities would include early IUP or ectopic gestation. Serial HCG estimations and follow-up sonogram as warranted should be considered. There are no adnexal masses.  There is no free fluid in pelvis. Electronically Signed   By: Elmer Picker M.D.   On: 04/23/2022 13:10     MAU Course  Procedures  MDM   Assessment and Plan   1. Supervision of other normal pregnancy, antepartum   2. Miscarriage    hCG is 400.  The uterus is empty and without uterine contents, which is suggestive of completion of miscarriage.  Patient's pain resolved after 1 dose of medication. No evidence of endometritis or UTI. Likely cramping  due to recent miscarriage. Discharge to home with diclofenac for the next couple of days.  Return precautions given.  Truett Mainland 04/23/2022, 2:27 PM

## 2022-04-23 NOTE — ED Triage Notes (Signed)
Patient had a miscarriage 3 days ago and is still having abdominal pain. She took the pills to induce the miscarriage. Still bleeding heavy

## 2022-04-23 NOTE — ED Notes (Signed)
Patient discharged to ED due to medical need.

## 2022-04-23 NOTE — ED Provider Notes (Signed)
Presented to the urgent care for 3-day history of abdominal pain. Reports she took pills to induce abortion and has been feeling worsening cramping and pain over the last few days.  Reports 10/10 pain in the abdomen with continued heavy bleeding.  I recommend patient be seen in the MAU.  We discussed that she likely needs an ultrasound to evaluate for retained products of conception, to make sure the abortion was complete. Gave map with directions. Patient discharged to MAU in stable condition given resources of this urgent care.    Budd Frei[place]uth, [place], [place] 04/23/22 1035

## 2022-05-18 ENCOUNTER — Encounter: Payer: Medicaid Other | Admitting: Family Medicine

## 2022-07-25 ENCOUNTER — Ambulatory Visit (HOSPITAL_COMMUNITY)
Admission: EM | Admit: 2022-07-25 | Discharge: 2022-07-25 | Disposition: A | Discharge status: Home / Self Care | Payer: Medicaid Other | Attending: Internal Medicine | Admitting: Internal Medicine

## 2022-07-25 ENCOUNTER — Encounter (HOSPITAL_COMMUNITY): Payer: Self-pay | Admitting: Emergency Medicine

## 2022-07-25 DIAGNOSIS — Z3201 Encounter for pregnancy test, result positive: Secondary | ICD-10-CM | POA: Diagnosis not present

## 2022-07-25 DIAGNOSIS — Z202 Contact with and (suspected) exposure to infections with a predominantly sexual mode of transmission: Secondary | ICD-10-CM | POA: Insufficient documentation

## 2022-07-25 LAB — HCG, QUANTITATIVE, PREGNANCY: hCG, Beta Chain, Quant, S: 1150 m[IU]/mL — ABNORMAL HIGH (ref <5)

## 2022-07-25 LAB — POCT URINALYSIS DIPSTICK, ED / UC
Glucose, UA: NEGATIVE mg/dL
Ketones, ur: >=160 mg/dL — AB
Nitrite: NEGATIVE
Protein, ur: NEGATIVE mg/dL
Specific Gravity, Urine: 1.025 (ref 1.005–1.030)
Urobilinogen, UA: 1 mg/dL (ref 0.0–1.0)
pH: 6.5 (ref 5.0–8.0)

## 2022-07-25 LAB — POC URINE PREG, ED: Preg Test, Ur: POSITIVE — AB

## 2022-07-25 MED ORDER — CEFTRIAXONE SODIUM 500 MG IJ SOLR
INTRAMUSCULAR | Status: AC
  Filled 2022-07-25: qty 500

## 2022-07-25 MED ORDER — CEFTRIAXONE SODIUM 500 MG IJ SOLR
500.0000 mg | INTRAMUSCULAR | Status: DC
  Administered 2022-07-25: 500 mg via INTRAMUSCULAR

## 2022-07-25 MED ORDER — LIDOCAINE HCL (PF) 1 % IJ SOLN
INTRAMUSCULAR | Status: AC
  Filled 2022-07-25: qty 2

## 2022-07-25 NOTE — ED Triage Notes (Signed)
Pt reports a possible exposure to Gonorrhea. Denies any current symptoms at this time. Also reports menstrual cycle is about 1 week late. LMP 06/17/2022.

## 2022-07-25 NOTE — ED Provider Notes (Signed)
[place]   [id number] 07/25/22 Arrival Time: 1055  ASSESSMENT & PLAN:  1. STD exposure   2. Positive pregnancy test   -Patient given IM Rocephin to treat for gonorrhea given her recent exposure.  STD panel pending, will call with results.  -Patient had a positive pregnancy test today.  I think this is a real positive result, I would have expected her beta-hCG to have decreased from her miscarriage 3 months ago.  I will order a quantitative beta-hCG today to confirm her urine pregnancy test and to use as a data point for her future OB/GYN provider.  I did advise her to see her OB/GYN to discuss next steps.  I also advised her to start taking prenatal vitamin, and to avoid smoking and alcohol moving forward.  All questions answered and she agrees to plan.   Meds ordered this encounter  Medications   cefTRIAXone (ROCEPHIN) injection 500 mg    Order Specific Question:   Antibiotic Indication:    Answer:   STD     Discharge Instructions      Your pregnancy test was positive today. Do not smoke or drink alcohol Take a prenatal vitamin daily You need to see an OBGYN doctor You were given a shot of rocephin today to treat you for gonorrhea exposure Your STD tests are still pending, we will call you if positive      Follow-up Information     Schedule an appointment as soon as possible for a visit  with [place].   Contact information: [address]  [address] [place] [id number] [phone]                 Reviewed expectations re: course of current medical issues. Questions answered. Outlined signs and symptoms indicating need for more acute intervention. Patient verbalized understanding. After Visit Summary given.   SUBJECTIVE: 26 year old female comes to urgent care to be evaluated for STDs.  She got a phone call from her most recent sexual partner who said he tested positive and was treated for  gonorrhea.  She last had unprotected sex with him about a week and a half ago.  She has not taken any for birth control.  Of note, she recently had a miscarriage about 3 months ago.  She has not had a period now for about 5 weeks.    She would like to be tested for STDs today.  She is asymptomatic today.  Denies any fevers, abdominal pain, vaginal discharge, vaginal bleeding.  Of note, she does have history of chlamydia in the past in 2022.  Patient's last menstrual period was 06/17/2022 (exact date). Past Surgical History:  Procedure Laterality Date   WISDOM TOOTH EXTRACTION       OBJECTIVE:  Vitals:   07/25/22 1202  BP: 106/63  Pulse: (!) 110  Resp: 18  Temp: 98.2 F (36.8 C)  TempSrc: Oral  SpO2: 99%     Physical Exam Vitals and nursing note reviewed.  Constitutional:      General: She is not in acute distress.    Appearance: Normal appearance.  HENT:     Head: Normocephalic.  Cardiovascular:     Rate and Rhythm: Normal rate.  Pulmonary:     Effort: Pulmonary effort is normal.  Abdominal:     General: Abdomen is flat.     Palpations: Abdomen is soft.     Tenderness: There is no abdominal tenderness. There is no right CVA  tenderness, left CVA tenderness or guarding.  Musculoskeletal:        General: Normal range of motion.  Neurological:     General: No focal deficit present.  Psychiatric:        Mood and Affect: Mood normal.      Labs: Results for orders placed or performed during the hospital encounter of 07/25/22  POC urine pregnancy  Result Value Ref Range   Preg Test, Ur Positive (A) Negative  POCT Urinalysis Dipstick (ED/UC)  Result Value Ref Range   Glucose, UA NEGATIVE NEGATIVE mg/dL   Bilirubin Urine SMALL (A) NEGATIVE   Ketones, ur >=160 (A) NEGATIVE mg/dL   Specific Gravity, Urine 1.025 1.005 - 1.030   Hgb urine dipstick TRACE (A) NEGATIVE   pH 6.5 5.0 - 8.0   Protein, ur NEGATIVE NEGATIVE mg/dL   Urobilinogen, UA 1.0 0.0 - 1.0 mg/dL    Nitrite NEGATIVE NEGATIVE   Leukocytes,Ua SMALL (A) NEGATIVE   Labs Reviewed  POC URINE PREG, ED - Abnormal; Notable for the following components:      Result Value   Preg Test, Ur Positive (*)    All other components within normal limits  POCT URINALYSIS DIPSTICK, ED / UC - Abnormal; Notable for the following components:   Bilirubin Urine SMALL (*)    Ketones, ur >=160 (*)    Hgb urine dipstick TRACE (*)    Leukocytes,Ua SMALL (*)    All other components within normal limits  HCG, QUANTITATIVE, PREGNANCY  CERVICOVAGINAL ANCILLARY ONLY    Imaging: No results found.   Allergies  Allergen Reactions   Chocolate Other (See Comments)    Reaction:  Migraines   Amoxicillin     Patient doesn't know reaction but has taken drug before                                               Past Medical History:  Diagnosis Date   Anemia    Headache    Infection    UTI    Social History   Socioeconomic History   Marital status: Single    Spouse name: Not on file   Number of children: Not on file   Years of education: Not on file   Highest education level: Not on file  Occupational History   Not on file  Tobacco Use   Smoking status: Every Day    Packs/day: 0.25    Years: 1.00    Total pack years: 0.25    Types: Cigarettes   Smokeless tobacco: Never   Tobacco comments:    2-3 cigs per day  Vaping Use   Vaping Use: Never used  Substance and Sexual Activity   Alcohol use: Not Currently    Comment: occasionally, prior to pregnancy   Drug use: Yes    Types: Marijuana    Comment: daily, 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   Sexual activity: Yes    Partners: Male    Birth control/protection: None  Other Topics Concern   Not on file  Social History Narrative   Not on file   Social Determinants of Health   Financial Resource Strain: Not on file  Food Insecurity: Not on file  Transportation Needs: Not on file  Physical Activity: Not on file  Stress: Not on file  Social Connections: Not  on file  Intimate Partner Violence: Not on  file    Family History  Problem Relation Age of Onset   Colon cancer Mother    Healthy Father    Asthma Sister    Diabetes Maternal Grandmother    COPD Neg Hx    Hypertension Neg Hx    Stroke Neg Hx       Reine Bristow, Dorian Pod, MD 07/25/22 1333

## 2022-07-25 NOTE — Discharge Instructions (Addendum)
Your pregnancy test was positive today. Do not smoke or drink alcohol Take a prenatal vitamin daily You need to see an OBGYN doctor You were given a shot of rocephin today to treat you for gonorrhea exposure Your STD tests are still pending, we will call you if positive

## 2022-07-26 LAB — CERVICOVAGINAL ANCILLARY ONLY
Chlamydia: NEGATIVE
Comment: NEGATIVE
Comment: NEGATIVE
Comment: NORMAL
Neisseria Gonorrhea: POSITIVE — AB
Trichomonas: NEGATIVE

## 2022-08-08 ENCOUNTER — Encounter (HOSPITAL_COMMUNITY): Payer: Self-pay

## 2022-08-08 ENCOUNTER — Inpatient Hospital Stay (HOSPITAL_COMMUNITY): Payer: Medicaid Other

## 2022-08-08 ENCOUNTER — Other Ambulatory Visit: Payer: Self-pay

## 2022-08-08 ENCOUNTER — Inpatient Hospital Stay (HOSPITAL_COMMUNITY)
Admission: AD | Admit: 2022-08-08 | Discharge: 2022-08-08 | Disposition: A | Discharge status: Home / Self Care | Payer: Medicaid Other | Attending: Obstetrics and Gynecology | Admitting: Obstetrics and Gynecology

## 2022-08-08 DIAGNOSIS — O26899 Other specified pregnancy related conditions, unspecified trimester: Secondary | ICD-10-CM

## 2022-08-08 DIAGNOSIS — R109 Unspecified abdominal pain: Secondary | ICD-10-CM | POA: Insufficient documentation

## 2022-08-08 DIAGNOSIS — O26891 Other specified pregnancy related conditions, first trimester: Secondary | ICD-10-CM | POA: Insufficient documentation

## 2022-08-08 DIAGNOSIS — Z3A01 Less than 8 weeks gestation of pregnancy: Secondary | ICD-10-CM | POA: Diagnosis not present

## 2022-08-08 DIAGNOSIS — O219 Vomiting of pregnancy, unspecified: Secondary | ICD-10-CM | POA: Diagnosis not present

## 2022-08-08 LAB — CBC
HCT: 41.4 % (ref 36.0–46.0)
Hemoglobin: 13.9 g/dL (ref 12.0–15.0)
MCH: 27.1 pg (ref 26.0–34.0)
MCHC: 33.6 g/dL (ref 30.0–36.0)
MCV: 80.9 fL (ref 80.0–100.0)
Platelets: 314 10*3/uL (ref 150–400)
RBC: 5.12 MIL/uL — ABNORMAL HIGH (ref 3.87–5.11)
RDW: 14.6 % (ref 11.5–15.5)
WBC: 10.2 10*3/uL (ref 4.0–10.5)
nRBC: 0 % (ref 0.0–0.2)

## 2022-08-08 LAB — URINALYSIS, ROUTINE W REFLEX MICROSCOPIC
Bilirubin Urine: NEGATIVE
Glucose, UA: NEGATIVE mg/dL
Ketones, ur: 80 mg/dL — AB
Nitrite: NEGATIVE
Protein, ur: NEGATIVE mg/dL
Specific Gravity, Urine: 1.015 (ref 1.005–1.030)
pH: 6 (ref 5.0–8.0)

## 2022-08-08 LAB — WET PREP, GENITAL
Clue Cells Wet Prep HPF POC: NONE SEEN
Sperm: NONE SEEN
Trich, Wet Prep: NONE SEEN
WBC, Wet Prep HPF POC: <10 (ref <10)
Yeast Wet Prep HPF POC: NONE SEEN

## 2022-08-08 LAB — ABO/RH: ABO/RH(D): B POS

## 2022-08-08 LAB — HCG, QUANTITATIVE, PREGNANCY: hCG, Beta Chain, Quant, S: 49570 m[IU]/mL — ABNORMAL HIGH (ref <5)

## 2022-08-08 MED ORDER — LACTATED RINGERS IV BOLUS
1000.0000 mL | Freq: Once | INTRAVENOUS | Status: AC
  Administered 2022-08-08: 1000 mL via INTRAVENOUS

## 2022-08-08 MED ORDER — ONDANSETRON HCL 4 MG PO TABS: 4.0000 mg | ORAL_TABLET | Freq: Three times a day (TID) | ORAL | 0 refills | Status: DC | PRN

## 2022-08-08 NOTE — MAU Note (Signed)
Priscilla [name] is a 26 y.o. here in MAU reporting: states she has been dehydrated for 3 days. States when she drinks water her thirst is not quenched. Also reports vomiting and abdominal pain. 3 episodes of vomiting. No bleeding.   LMP: 06/17/22  Onset of complaint: ongoing  Pain score: 5/10  Vitals:   08/08/22 1201  BP: 117/63  Pulse: 92  Resp: 16  Temp: 98.3 F (36.8 C)  SpO2: 100%     FHT:NA  Lab orders placed from triage: ua, upt

## 2022-08-08 NOTE — MAU Provider Note (Signed)
History     CSN: [id number]  Arrival date and time: 08/08/22 1144   Event Date/Time   First Provider Initiated Contact with Patient 08/08/22 1500      Chief Complaint  Patient presents with   Abdominal Pain   Nausea   Priscilla [name] , a  26 y.o. [phone] at [id number] to MAU with complaints of on-going nausea and vomiting that has worsened In the last 3 days. Patient also complains of abdominal pain and cramping that started yesterday at work. She endorses 3 episodes of vomiting this morning but none since. Denies attempting to relive symptoms and denies worsening conditions. She states that abdominal pain is intermittent and describes it as "sharp, across the bottom of her lower pelvis." Patient rates pain a 5/10. She denies vaginal bleeding, abnormal vaginal discharge, and urinary symptoms. She states she was just treated for Gonorrhea 2 weeks ago. Denies back pain.         OB History     Gravida  5   Para  2   Term  2   Preterm      AB  2   Living  2      SAB  2   IAB      Ectopic      Multiple  0   Live Births  2           Past Medical History:  Diagnosis Date   Anemia    Headache    Infection    UTI    Past Surgical History:  Procedure Laterality Date   NO PAST SURGERIES     WISDOM TOOTH EXTRACTION      Family History  Problem Relation Age of Onset   Colon cancer Mother    Healthy Father    Asthma Sister    Diabetes Maternal Grandmother    COPD Neg Hx    Hypertension Neg Hx    Stroke Neg Hx     Social History   Tobacco Use   Smoking status: Every Day    Packs/day: 0.25    Years: 1.00    Total pack years: 0.25    Types: Cigarettes   Smokeless tobacco: Never   Tobacco comments:    2-3 cigs per day  Vaping Use   Vaping Use: Never used  Substance Use Topics   Alcohol use: Not Currently    Comment: occasionally, prior to pregnancy   Drug use: Not Currently    Types: Marijuana    Comment: daily, 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     Allergies:  Allergies  Allergen Reactions   Chocolate Other (See Comments)    Reaction:  Migraines   Amoxicillin     Patient doesn't know reaction but has taken drug before    Medications Prior to Admission  Medication Sig Dispense Refill Last Dose   acetaminophen (TYLENOL) 325 MG tablet Take 650 mg by mouth every 6 (six) hours as needed.   Past Month   Blood Pressure Monitoring (BLOOD PRESSURE KIT) DEVI 1 kit by Does not apply route once a week. 1 each 0 Unknown   diclofenac (VOLTAREN) 75 MG EC tablet Take 1 tablet (75 mg total) by mouth 2 (two) times daily as needed. 14 tablet 0 Unknown   diphenhydramine-acetaminophen (TYLENOL PM) 25-500 MG TABS tablet Take 1 tablet by mouth daily as needed (headache/pain).      Misc. Devices (GOJJI WEIGHT SCALE) MISC 1 Device by Does not apply route every 30 (thirty) days.  1 each 0 Unknown   ondansetron (ZOFRAN-ODT) 8 MG disintegrating tablet Take 1 tablet (8 mg total) by mouth every 8 (eight) hours as needed for nausea or vomiting. 20 tablet 0 Unknown   Prenatal Vit-FePoly-FA-DHA (VITAFOL-ONE) 29-1-200 MG CAPS Take 1 capsule by mouth daily. 30 capsule 11 Unknown   rizatriptan (MAXALT) 5 MG tablet Take 2 tablets (10 mg total) by mouth once as needed for migraine. May repeat in 2 hours if needed (Patient not taking: Reported on 04/04/2022) 30 tablet 2 Unknown   senna-docusate (SENOKOT-S) 8.6-50 MG tablet Take 1 tablet by mouth at bedtime as needed for mild constipation. 20 tablet 0 Unknown    Review of Systems  Constitutional:  Negative for chills, fatigue and fever.  Eyes:  Negative for pain and visual disturbance.  Respiratory:  Negative for apnea, shortness of breath and wheezing.   Cardiovascular:  Negative for chest pain and palpitations.  Gastrointestinal:  Positive for abdominal pain, nausea and vomiting. Negative for constipation and diarrhea.  Genitourinary:  Positive for pelvic pain. Negative for difficulty urinating, dysuria, vaginal  bleeding, vaginal discharge and vaginal pain.  Musculoskeletal:  Negative for back pain.  Neurological:  Negative for dizziness, seizures, weakness and headaches.  Psychiatric/Behavioral:  Negative for suicidal ideas.    Physical Exam   Blood pressure 113/61, pulse 81, temperature 98.3 F (36.8 C), temperature source Oral, resp. rate 16, height _0  (1.549 m), weight 79 kg, last menstrual period 06/17/2022, SpO2 100 %, unknown if currently breastfeeding.  Physical Exam Vitals and nursing note reviewed.  Constitutional:      General: She is not in acute distress.    Appearance: Normal appearance.  HENT:     Head: Normocephalic.  Cardiovascular:     Rate and Rhythm: Normal rate and regular rhythm.  Pulmonary:     Effort: Pulmonary effort is normal.  Abdominal:     Palpations: Abdomen is soft.     Tenderness: There is no abdominal tenderness. There is no right CVA tenderness or left CVA tenderness.  Musculoskeletal:     Cervical back: Normal range of motion.  Skin:    General: Skin is warm and dry.     Capillary Refill: Capillary refill takes less than 2 seconds.  Neurological:     Mental Status: She is alert and oriented to person, place, and time.  Psychiatric:        Mood and Affect: Mood normal.     MAU Course  Procedures Orders Placed This Encounter  Procedures   Wet prep, genital   Culture, OB Urine   US OB LESS THAN 14 WEEKS WITH OB TRANSVAGINAL   Urinalysis, Routine w reflex microscopic Urine, Clean Catch   CBC   hCG, quantitative, pregnancy   Diet NPO time specified   ABO/Rh   Insert peripheral IV   Results for orders placed or performed during the hospital encounter of 08/08/22 (from the past 24 hour(s))  Urinalysis, Routine w reflex microscopic Urine, Clean Catch     Status: Abnormal   Collection Time: 08/08/22 12:09 PM  Result Value Ref Range   Color, Urine YELLOW YELLOW   APPearance HAZY (A) CLEAR   Specific Gravity, Urine 1.015 1.005 - 1.030   pH  6.0 5.0 - 8.0   Glucose, UA NEGATIVE NEGATIVE mg/dL   Hgb urine dipstick SMALL (A) NEGATIVE   Bilirubin Urine NEGATIVE NEGATIVE   Ketones, ur 80 (A) NEGATIVE mg/dL   Protein, ur NEGATIVE NEGATIVE mg/dL   Nitrite NEGATIVE NEGATIVE  Leukocytes,Ua SMALL (A) NEGATIVE   RBC / HPF 0-5 0 - 5 RBC/hpf   WBC, UA 0-5 0 - 5 WBC/hpf   Bacteria, UA RARE (A) NONE SEEN   Squamous Epithelial / LPF 11-20 0 - 5   Mucus PRESENT    Amorphous Crystal PRESENT   CBC     Status: Abnormal   Collection Time: 08/08/22  3:14 PM  Result Value Ref Range   WBC 10.2 4.0 - 10.5 K/uL   RBC 5.12 (H) 3.87 - 5.11 MIL/uL   Hemoglobin 13.9 12.0 - 15.0 g/dL   HCT 41.4 36.0 - 46.0 %   MCV 80.9 80.0 - 100.0 fL   MCH 27.1 26.0 - 34.0 pg   MCHC 33.6 30.0 - 36.0 g/dL   RDW 14.6 11.5 - 15.5 %   Platelets 314 150 - 400 K/uL   nRBC 0.0 0.0 - 0.2 %  ABO/Rh     Status: None   Collection Time: 08/08/22  3:14 PM  Result Value Ref Range   ABO/RH(D) B POS    No rh immune globuloin      NOT A RH IMMUNE GLOBULIN CANDIDATE, PT RH POSITIVE Performed at [facility], [place] [address], [place], [place] [address]   hCG, quantitative, pregnancy     Status: Abnormal   Collection Time: 08/08/22  3:14 PM  Result Value Ref Range   hCG, Beta Chain, Quant, S 49,570 (H) <5 mIU/mL  Wet prep, genital     Status: None   Collection Time: 08/08/22  3:14 PM   Specimen: PATH Cytology Cervicovaginal Ancillary Only  Result Value Ref Range   Yeast Wet Prep HPF POC NONE SEEN NONE SEEN   Trich, Wet Prep NONE SEEN NONE SEEN   Clue Cells Wet Prep HPF POC NONE SEEN NONE SEEN   WBC, Wet Prep HPF POC <10 <10   Sperm NONE SEEN    US OB LESS THAN 14 WEEKS WITH OB TRANSVAGINAL  Result Date: 08/08/2022 CLINICAL DATA:  Abdominal pain affecting pregnancy. No beta HCG available at time dictation. EXAM: OBSTETRIC <14 WK [place] AND TRANSVAGINAL OB US TECHNIQUE: Both transabdominal and transvaginal ultrasound examinations were performed for complete  evaluation of the gestation as well as the maternal uterus, adnexal regions, and pelvic cul-de-sac. Transvaginal technique was performed to assess early pregnancy. COMPARISON:  None Available. FINDINGS: Intrauterine gestational sac: Single Yolk sac:  Visualized. Embryo:  Visualized. Cardiac Activity: Visualized. Heart Rate: 120  bpm CRL:  5.5 mm    w   2 d                  [place] EDC: April 01, 2023 Subchorionic hemorrhage:  None visualized. Maternal uterus/adnexae: Trace pelvic free fluid. IMPRESSION: Early single viable intrauterine pregnancy at 6 weeks and 2 days. Electronically Signed   By: Dahlia Bailiff M.D.   On: 08/08/2022 16:14    Lab results reviewed and interpreted by me.   MDM - Positive UPT and Quant from outside Hospital. No [place] to confirm pregnancy location. In the presence of Abdominal pain and vomiting [place] ordered.  - UA positive for ketones hbg leuks and bacteria. Reflexed to culture and IV fluids ordered  - Patient not actively vomiting - CBC normal  - Quant increased significantly since 2 weeks ago.  - Wet prep normal  - US revealed a IUP measuring 6 weeks 2 days. Low suspicion for ectopic pregnancy.  - PO Challenge successful in MAU.  - Plan for discharge.  Assessment and Plan   1. Nausea and vomiting during pregnancy   2. Abdominal pain affecting pregnancy   3. [redacted] weeks gestation of pregnancy    - Discussed nausea and vomiting in pregnancy can be a normal discomfort in pregnancy.  - Rx for PO Zofran sent to outpatient pharmacy.   - Discussed that patient has a normal IUP, and recommended that patient set up prenatal care. Patient desires to go to [facility].  - Worsening signs and return precautions reviewed with patient.  - Discussed that abdominal pain likely caused by dehydration. Discussed to increase PO hydration and begin to incorporate small frequent snacks throughout the day.  - Early pregnancy precautions reviewed.  - Patient discharged home in stable condition and may  return to MAU as needed.   Jacquiline Doe, MSN CNM  08/08/2022, 3:01 PM

## 2022-08-09 LAB — GC/CHLAMYDIA PROBE AMP (~~LOC~~) NOT AT ARMC
Chlamydia: NEGATIVE
Comment: NEGATIVE
Comment: NORMAL
Neisseria Gonorrhea: NEGATIVE

## 2022-08-09 LAB — CULTURE, OB URINE

## 2022-08-17 ENCOUNTER — Ambulatory Visit (INDEPENDENT_AMBULATORY_CARE_PROVIDER_SITE_OTHER): Payer: Medicaid Other

## 2022-08-17 DIAGNOSIS — Z1332 Encounter for screening for maternal depression: Secondary | ICD-10-CM | POA: Diagnosis not present

## 2022-08-17 DIAGNOSIS — Z3480 Encounter for supervision of other normal pregnancy, unspecified trimester: Secondary | ICD-10-CM

## 2022-08-17 MED ORDER — CITRANATAL BLOOM 90-1 MG PO TABS: 1.0000 | ORAL_TABLET | Freq: Every day | ORAL | 11 refills | Status: DC

## 2022-08-17 MED ORDER — GOJJI WEIGHT SCALE MISC: 1.0000 | 0 refills | Status: DC

## 2022-08-17 MED ORDER — PROMETHAZINE HCL 25 MG PO TABS: 25.0000 mg | ORAL_TABLET | Freq: Four times a day (QID) | ORAL | 2 refills | Status: DC | PRN

## 2022-08-17 MED ORDER — BLOOD PRESSURE KIT DEVI: 1.0000 | 0 refills | Status: DC

## 2022-08-17 NOTE — Progress Notes (Signed)
New OB Intake  I connected with Priscilla [name] on 08/17/22 at  9:00 AM EST by telephone Video Visit and verified that I am speaking with the correct person using two identifiers. Nurse is located at [facility] and pt is located at Home.  I discussed the limitations, risks, security and privacy concerns of performing an evaluation and management service by telephone and the availability of in person appointments. I also discussed with the patient that there may be a patient responsible charge related to this service. The patient expressed understanding and agreed to proceed.  I explained I am completing New OB Intake today. We discussed EDD of 03/24/23 that is based on LMP of 06/17/22. Pt is G5/P2022. I reviewed her allergies, medications, Medical/Surgical/OB history, and appropriate screenings. I informed her of [facility] services. [facility] information placed in AVS. Based on history, this is a low risk pregnancy.  Patient Active Problem List   Diagnosis Date Noted   Miscarriage 04/18/2022   Supervision of other normal pregnancy, antepartum 04/04/2022   Healthcare maintenance 12/15/2021   Lipoma of back 12/15/2021   Hemorrhagic cyst of ovary 12/14/2021   Migraines 12/14/2021   Vacuum-assisted vaginal delivery 05/22/2019   Indication for care in labor or delivery 05/21/2019   Bacterial vaginitis 06/02/2018   Normal delivery 12/22/2012   ALLERGIC RHINITIS 03/22/2006   STRABISMUS 02/25/2002    Concerns addressed today  Delivery Plans Plans to deliver at [facility]. Patient given information for [facility] Healthy Baby website for more information about [facility] [place]. Patient is not interested in water birth. Offered upcoming OB visit with CNM to discuss further.  MyChart/Babyscripts MyChart access verified. I explained pt will have some visits in office and some virtually. Babyscripts instructions given and order placed. Patient verifies receipt of registration text/e-mail. Account successfully  created and app downloaded.  Blood Pressure Cuff/Weight Scale Blood pressure cuff ordered for patient to pick-up from [employer]. Explained after first prenatal appt pt will check weekly and document in 29. Patient does not have weight scale; order sent to [facility], patient may track weight weekly in Babyscripts.  Anatomy US Explained first scheduled [place] will be around 19 weeks. Anatomy US TBD.  Labs Discussed Johnsie Cancel genetic screening with patient. Would like both Panorama and Horizon drawn at new OB visit. Routine prenatal labs needed.  COVID Vaccine Patient has had COVID vaccine.   Is patient a CenteringPregnancy candidate?  Declined Declined due to Group setting Not a candidate due to  If accepted,   Social Determinants of Health Food Insecurity: Patient denies food insecurity. WIC Referral: Patient is interested in referral to [facility]  Transportation: Patient denies transportation needs. Childcare: Discussed no children allowed at ultrasound appointments. Offered childcare services; patient expresses need for childcare services. Childcare scheduled for appropriate appointments and information given to patient.  First visit review I reviewed new OB appt with patient. I explained they will have a provider visit that includes prenatal labs, pap smear, std screening, genetic screening, and discuss plan of care for pregnancy. Explained pt will be seen by Baltazar Najjar at first visit; encounter routed to appropriate provider. Explained that patient will be seen by pregnancy navigator following visit with provider.   Priscilla Lei, RN 08/17/2022  8:28 AM

## 2022-08-22 ENCOUNTER — Encounter: Payer: Medicaid Other | Admitting: Obstetrics

## 2022-08-29 ENCOUNTER — Other Ambulatory Visit: Payer: Self-pay | Admitting: Emergency Medicine

## 2022-08-29 ENCOUNTER — Other Ambulatory Visit: Payer: Self-pay

## 2022-08-29 DIAGNOSIS — Z3480 Encounter for supervision of other normal pregnancy, unspecified trimester: Secondary | ICD-10-CM

## 2022-08-29 MED ORDER — DOXYLAMINE-PYRIDOXINE 10-10 MG PO TBEC: DELAYED_RELEASE_TABLET | ORAL | 2 refills | Status: DC

## 2022-08-29 MED ORDER — VITAFOL ULTRA 29-0.6-0.4-200 MG PO CAPS: 1.0000 | ORAL_CAPSULE | Freq: Every day | ORAL | 11 refills | Status: DC

## 2022-08-29 NOTE — Progress Notes (Signed)
Rx for PNV, Rx for new nausea medicine. Pt did not feel well after taking phenergan.

## 2022-09-06 ENCOUNTER — Other Ambulatory Visit (HOSPITAL_COMMUNITY)
Admission: RE | Admit: 2022-09-06 | Discharge: 2022-09-06 | Disposition: A | Discharge status: Home / Self Care | Payer: Medicaid Other | Source: Ambulatory Visit | Attending: Obstetrics | Admitting: Obstetrics

## 2022-09-06 ENCOUNTER — Encounter: Payer: Self-pay | Admitting: Obstetrics

## 2022-09-06 ENCOUNTER — Ambulatory Visit (INDEPENDENT_AMBULATORY_CARE_PROVIDER_SITE_OTHER): Payer: Medicaid Other | Admitting: Obstetrics

## 2022-09-06 VITALS — BP 117/69 | HR 105 | Ht 61.0 in | Wt 175.1 lb

## 2022-09-06 DIAGNOSIS — Z3480 Encounter for supervision of other normal pregnancy, unspecified trimester: Secondary | ICD-10-CM | POA: Insufficient documentation

## 2022-09-06 DIAGNOSIS — Z3A11 11 weeks gestation of pregnancy: Secondary | ICD-10-CM | POA: Diagnosis not present

## 2022-09-06 NOTE — Progress Notes (Signed)
Patient presents for initial OB visit. No concerns at this time.

## 2022-09-06 NOTE — Progress Notes (Signed)
Subjective:    Priscilla [name] is being seen today for her first obstetrical visit.  This is not a planned pregnancy. She is at 95w4dgestation. Her obstetrical history is significant for smoker. Relationship with FOB: significant other, not living together. Patient does intend to breast feed. Pregnancy history fully reviewed.  The information documented in the HPI was reviewed and verified.  Menstrual History: OB History     Gravida  5   Para  2   Term  2   Preterm      AB  2   Living  2      SAB  2   IAB      Ectopic      Multiple  0   Live Births  2           Patient's last menstrual period was 06/17/2022 (exact date).    Past Medical History:  Diagnosis Date   Anemia    Headache    Infection    UTI    Past Surgical History:  Procedure Laterality Date   NO PAST SURGERIES     WISDOM TOOTH EXTRACTION      (Not in a hospital admission)  Allergies  Allergen Reactions   Chocolate Other (See Comments)    Reaction:  Migraines   Amoxicillin     Patient doesn't know reaction but has taken drug before    Social History   Tobacco Use   Smoking status: Every Day    Packs/day: 0.25    Years: 1.00    Total pack years: 0.25    Types: Cigarettes   Smokeless tobacco: Never   Tobacco comments:    2-3 cigs per day  Substance Use Topics   Alcohol use: Not Currently    Comment: occasionally, prior to pregnancy    Family History  Problem Relation Age of Onset   Colon cancer Mother    Healthy Father    Asthma Sister    Diabetes Maternal Grandmother    COPD Neg Hx    Hypertension Neg Hx    Stroke Neg Hx      Review of Systems Constitutional: negative for weight loss Gastrointestinal: negative for vomiting Genitourinary:negative for genital lesions and vaginal discharge and dysuria Musculoskeletal:negative for back pain Behavioral/Psych: negative for abusive relationship, depression, illegal drug usage and tobacco use    Objective:    BP  117/69   Pulse (!) 105   Wt 175 lb 1.6 oz (79.4 kg)   LMP 06/17/2022 (Exact Date)   BMI 33.08 kg/m  General Appearance:    Alert, cooperative, no distress, appears stated age  Head:    Normocephalic, without obvious abnormality, atraumatic  Eyes:    PERRL, conjunctiva/corneas clear, EOM's intact, fundi    benign, both eyes  Ears:    Normal TM's and external ear canals, both ears  Nose:   Nares normal, septum midline, mucosa normal, no drainage    or sinus tenderness  Throat:   Lips, mucosa, and tongue normal; teeth and gums normal  Neck:   Supple, symmetrical, trachea midline, no adenopathy;    thyroid:  no enlargement/tenderness/nodules; no carotid   bruit or JVD  Back:     Symmetric, no curvature, ROM normal, no CVA tenderness  Lungs:     Clear to auscultation bilaterally, respirations unlabored  Chest Wall:    No tenderness or deformity   Heart:    Regular rate and rhythm, S1 and S2 normal, no murmur, rub  or gallop  Breast Exam:    No tenderness, masses, or nipple abnormality  Abdomen:     Soft, non-tender, bowel sounds active all four quadrants,    no masses, no organomegaly  Genitalia:    Normal female without lesion, discharge or tenderness  Extremities:   Extremities normal, atraumatic, no cyanosis or edema  Pulses:   2+ and symmetric all extremities  Skin:   Skin color, texture, turgor normal, no rashes or lesions  Lymph nodes:   Cervical, supraclavicular, and axillary nodes normal  Neurologic:   CNII-XII intact, normal strength, sensation and reflexes    throughout      Lab Review Urine pregnancy test Labs reviewed yes Radiologic studies reviewed no  Assessment:    Pregnancy at 65w4dweeks    Plan:    1. Supervision of other normal pregnancy, antepartum Rx: - CBC/D/Plt+RPR+Rh+ABO+RubIgG... - Cervicovaginal ancillary only - Culture, OB Urine - Cytology - PAP   Prenatal vitamins.  Counseling provided regarding continued use of seat belts, cessation of  alcohol consumption, smoking or use of illicit drugs; infection precautions i.e., influenza/TDAP immunizations, toxoplasmosis,CMV, parvovirus, listeria and varicella; workplace safety, exercise during pregnancy; routine dental care, safe medications, sexual activity, hot tubs, saunas, pools, travel, caffeine use, fish and methlymercury, potential toxins, hair treatments, varicose veins Weight gain recommendations per IOM guidelines reviewed: underweight/BMI< 18.5--> gain 28 - 40 lbs; normal weight/BMI 18.5 - 24.9--> gain 25 - 35 lbs; overweight/BMI 25 - 29.9--> gain 15 - 25 lbs; obese/BMI >30->gain  11 - 20 lbs Problem list reviewed and updated. FIRST/CF mutation testing/NIPT/QUAD SCREEN/fragile X/Ashkenazi Jewish population testing/Spinal muscular atrophy discussed: requested. Role of ultrasound in pregnancy discussed; fetal survey: requested. Amniocentesis discussed: not indicated.   Orders Placed This Encounter  Procedures   Culture, OB Urine   CBC/D/Plt+RPR+Rh+ABO+RubIgG...    Order Specific Question:   Release to patient    Answer:   Immediate   HORIZ[facility]    ==========Department Information========== ID: [id number]:[facility] [place]Dept: [phone] Fax: [phone]    Order Specific Question:   Specify the name or ID of a valid Horizon Custom Panel:    Answer:   HBASIC    Order Specific Question:   Is patient pregnant?    Answer:   Yes    Order Specific Question:   Patient authorizes NJohnsie Cancelto share patient's Horizon test results with partner and their medical provider?    Answer:   No    Order Specific Question:   Practice ensures that HIPAA consent is obtained and will make available to [facility]upon request?    Answer:   Yes    Order Specific Question:   By placing this electronic order I confirm the testing ordered herein is medically  necessary and this patient has been informed of the details of the genetic test(s) ordered, including the risks, benefits, and alternatives, and has consented to testing.    Answer:   Yes    Order Specific Question:   What type of billing?    Answer:   BCIT GroupSpecific Question:   Select an order diagnosis: For additional options refer to [email or web address]   Answer:   Encounter for supervision of other normal pregnancy, first trimester [[[id number]]   Order Specific Question:   Tay-Sachs add-on test?  Answer:   No    Order Specific Question:   Release to patient    Answer:   Immediate [1]   PANORAMA PRENATAL TEST FULL PANEL    ==========Department Information========== ID: [id number] Department:[facility] [place], [place] [address] Dept: [phone] Dept Fax: [phone]     Order Specific Question:   Expected due date (MM/DD/YYYY):    Answer:   03/24/2023    Order Specific Question:   Is this a twin pregnancy? (viable, no vanished twin)    Answer:   No    Order Specific Question:   Is this a surrogate or egg donor pregnancy?    Answer:   No    Order Specific Question:   I want fetal sex included in the report:    Answer:   Yes    Order Specific Question:   Maternal Weight (lbs):    Answer:   77    Order Specific Question:   Which Microdeletion Panel should be ordered?    Answer:   Extended Panel    Order Specific Question:   What type of billing?    Answer:   Programme researcher, broadcasting/film/video Question:   By placing this electronic order I confirm the testing ordered herein is medically necessary and this patient has been informed of the details of the genetic test(s) ordered, including the risks, benefits, and alternatives, and has consented to testing.    Answer:   Yes    Order Specific Question:   Select an order diagnosis: For  additional options refer to [email or web address]    Answer:   Encounter for supervision of other normal pregnancy in first trimester [[id number]]    Follow up in 4 weeks.  I have spent a total of 20 minutes of face-to-face time, excluding clinical staff time, reviewing notes and preparing to see patient, ordering tests and/or medications, and counseling the patient.   Wm Fruchter A. Jodi Mourning MD 09/06/2022

## 2022-09-07 LAB — CBC/D/PLT+RPR+RH+ABO+RUBIGG...
Antibody Screen: NEGATIVE
Basophils Absolute: 0 10*3/uL (ref 0.0–0.2)
Basos: 0 %
EOS (ABSOLUTE): 0.1 10*3/uL (ref 0.0–0.4)
Eos: 1 %
HCV Ab: NONREACTIVE
HIV Screen 4th Generation wRfx: NONREACTIVE
Hematocrit: 34.9 % (ref 34.0–46.6)
Hemoglobin: 11.7 g/dL (ref 11.1–15.9)
Hepatitis B Surface Ag: NEGATIVE
Immature Grans (Abs): 0 10*3/uL (ref 0.0–0.1)
Immature Granulocytes: 0 %
Lymphocytes Absolute: 2.3 10*3/uL (ref 0.7–3.1)
Lymphs: 24 %
MCH: 26.8 pg (ref 26.6–33.0)
MCHC: 33.5 g/dL (ref 31.5–35.7)
MCV: 80 fL (ref 79–97)
Monocytes Absolute: 0.5 10*3/uL (ref 0.1–0.9)
Monocytes: 5 %
Neutrophils Absolute: 6.7 10*3/uL (ref 1.4–7.0)
Neutrophils: 70 %
Platelets: 266 10*3/uL (ref 150–450)
RBC: 4.37 x10E6/uL (ref 3.77–5.28)
RDW: 13.3 % (ref 11.7–15.4)
RPR Ser Ql: NONREACTIVE
Rh Factor: POSITIVE
Rubella Antibodies, IGG: 1.09 index (ref >0.99)
WBC: 9.6 10*3/uL (ref 3.4–10.8)

## 2022-09-07 LAB — HCV INTERPRETATION

## 2022-09-07 LAB — CERVICOVAGINAL ANCILLARY ONLY
Chlamydia: NEGATIVE
Comment: NEGATIVE
Comment: NEGATIVE
Comment: NORMAL
Neisseria Gonorrhea: NEGATIVE
Trichomonas: NEGATIVE

## 2022-09-08 LAB — CULTURE, OB URINE

## 2022-09-08 LAB — URINE CULTURE, OB REFLEX

## 2022-09-12 LAB — CYTOLOGY - PAP: Diagnosis: NEGATIVE

## 2022-09-15 LAB — PANORAMA PRENATAL TEST FULL PANEL:PANORAMA TEST PLUS 5 ADDITIONAL MICRODELETIONS: FETAL FRACTION: 7.3

## 2022-09-17 NOTE — L&D Delivery Note (Addendum)
LABOR COURSE 27yo [redacted]w[redacted]d IOL for NR NST  Delivery Note Called to room and patient was complete and pushing. Head delivered vertex LOA. No nuchal cord present. Shoulder and body delivered in usual fashion. At  0515 a viable and healthy female was delivered via Vaginal, Spontaneous.  Infant with spontaneous cry, placed on mother's abdomen, dried and stimulated. Cord clamped x 2 after 1-minute delay, and cut by support person. Cord blood drawn. Placenta delivered spontaneously with gentle cord traction. Appears intact. Fundus firm with massage and Pitocin. Labia, perineum, vagina, and cervix inspected with minimal abrasions to vaginal wall, no lacerations requiring repair.    APGAR: 8,9.   Cord: 3VC.   Cord pH: sent to lab  Anesthesia:   Episiotomy: None Lacerations: none Est. Blood Loss (mL): 58  Mom to postpartum.  Baby to Couplet care / Skin to Skin.  Everhart, Kirstie, DO 5:33 AM     Fellow ATTESTATION  I was present and gloved for this delivery and agree with the above documentation in the resident's note except as below.   Celedonio Savage, MD [facility] (Faculty Practice) 03/25/2023, 6:40 AM

## 2022-09-19 LAB — HORIZON CUSTOM: REPORT SUMMARY: NEGATIVE

## 2022-09-23 ENCOUNTER — Encounter (HOSPITAL_COMMUNITY): Payer: Self-pay | Admitting: Obstetrics and Gynecology

## 2022-09-23 ENCOUNTER — Inpatient Hospital Stay (HOSPITAL_COMMUNITY)
Admission: AD | Admit: 2022-09-23 | Discharge: 2022-09-24 | Disposition: A | Discharge status: Home / Self Care | Payer: Medicaid Other | Attending: Obstetrics and Gynecology | Admitting: Obstetrics and Gynecology

## 2022-09-23 DIAGNOSIS — E860 Dehydration: Secondary | ICD-10-CM | POA: Insufficient documentation

## 2022-09-23 DIAGNOSIS — Z3A14 14 weeks gestation of pregnancy: Secondary | ICD-10-CM

## 2022-09-23 DIAGNOSIS — O99282 Endocrine, nutritional and metabolic diseases complicating pregnancy, second trimester: Secondary | ICD-10-CM | POA: Insufficient documentation

## 2022-09-23 DIAGNOSIS — O219 Vomiting of pregnancy, unspecified: Secondary | ICD-10-CM | POA: Insufficient documentation

## 2022-09-23 NOTE — MAU Note (Signed)
.  Priscilla [name] is a 27 y.o. at 41w0dhere in MAU reporting: nausea and vomiting for the past two days, unable to keep anything down. Reports 40 episodes of emesis in the past 24 hours. Abdominal cramping 3/10 that began after the nausea. Denies VB or LOF.   She has taken Zofran, Promethazine and had no relief.  LMP: N/A Onset of complaint: two days ago Pain score: 3/10 Vitals:   09/23/22 2325  BP: 113/62  Pulse: 89  Resp: 17  Temp: 98.5 F (36.9 C)  SpO2: 100%     FHT:141 Lab orders placed from triage:  UA

## 2022-09-24 ENCOUNTER — Inpatient Hospital Stay (EMERGENCY_DEPARTMENT_HOSPITAL)
Admission: AD | Admit: 2022-09-24 | Discharge: 2022-09-24 | Disposition: A | Discharge status: Home / Self Care | Payer: Medicaid Other | Source: Home / Self Care | Attending: Family Medicine | Admitting: Family Medicine

## 2022-09-24 ENCOUNTER — Other Ambulatory Visit: Payer: Self-pay

## 2022-09-24 ENCOUNTER — Encounter (HOSPITAL_COMMUNITY): Payer: Self-pay | Admitting: Family Medicine

## 2022-09-24 DIAGNOSIS — E860 Dehydration: Secondary | ICD-10-CM

## 2022-09-24 DIAGNOSIS — Z3480 Encounter for supervision of other normal pregnancy, unspecified trimester: Secondary | ICD-10-CM

## 2022-09-24 DIAGNOSIS — O219 Vomiting of pregnancy, unspecified: Secondary | ICD-10-CM

## 2022-09-24 DIAGNOSIS — Z3A14 14 weeks gestation of pregnancy: Secondary | ICD-10-CM

## 2022-09-24 DIAGNOSIS — O99282 Endocrine, nutritional and metabolic diseases complicating pregnancy, second trimester: Secondary | ICD-10-CM

## 2022-09-24 DIAGNOSIS — O99280 Endocrine, nutritional and metabolic diseases complicating pregnancy, unspecified trimester: Secondary | ICD-10-CM

## 2022-09-24 LAB — URINALYSIS, ROUTINE W REFLEX MICROSCOPIC
Bilirubin Urine: NEGATIVE
Bilirubin Urine: NEGATIVE
Glucose, UA: NEGATIVE mg/dL
Glucose, UA: NEGATIVE mg/dL
Hgb urine dipstick: NEGATIVE
Hgb urine dipstick: NEGATIVE
Ketones, ur: 80 mg/dL — AB
Ketones, ur: 80 mg/dL — AB
Nitrite: NEGATIVE
Nitrite: NEGATIVE
Protein, ur: NEGATIVE mg/dL
Protein, ur: NEGATIVE mg/dL
Specific Gravity, Urine: 1.015 (ref 1.005–1.030)
Specific Gravity, Urine: 1.018 (ref 1.005–1.030)
pH: 5 (ref 5.0–8.0)
pH: 6 (ref 5.0–8.0)

## 2022-09-24 LAB — COMPREHENSIVE METABOLIC PANEL
ALT: 12 U/L (ref 0–44)
AST: 18 U/L (ref 15–41)
Albumin: 3.7 g/dL (ref 3.5–5.0)
Alkaline Phosphatase: 47 U/L (ref 38–126)
Anion gap: 10 (ref 5–15)
BUN: <5 mg/dL — ABNORMAL LOW (ref 6–20)
CO2: 20 mmol/L — ABNORMAL LOW (ref 22–32)
Calcium: 8.9 mg/dL (ref 8.9–10.3)
Chloride: 103 mmol/L (ref 98–111)
Creatinine, Ser: 0.65 mg/dL (ref 0.44–1.00)
GFR, Estimated: >60 mL/min (ref >60)
Glucose, Bld: 113 mg/dL — ABNORMAL HIGH (ref 70–99)
Potassium: 3.3 mmol/L — ABNORMAL LOW (ref 3.5–5.1)
Sodium: 133 mmol/L — ABNORMAL LOW (ref 135–145)
Total Bilirubin: 0.7 mg/dL (ref 0.3–1.2)
Total Protein: 7.3 g/dL (ref 6.5–8.1)

## 2022-09-24 LAB — CBC WITH DIFFERENTIAL/PLATELET
Abs Immature Granulocytes: 0.03 10*3/uL (ref 0.00–0.07)
Basophils Absolute: 0.1 10*3/uL (ref 0.0–0.1)
Basophils Relative: 0 %
Eosinophils Absolute: 0 10*3/uL (ref 0.0–0.5)
Eosinophils Relative: 0 %
HCT: 37.6 % (ref 36.0–46.0)
Hemoglobin: 12.9 g/dL (ref 12.0–15.0)
Immature Granulocytes: 0 %
Lymphocytes Relative: 15 %
Lymphs Abs: 2 10*3/uL (ref 0.7–4.0)
MCH: 27.3 pg (ref 26.0–34.0)
MCHC: 34.3 g/dL (ref 30.0–36.0)
MCV: 79.5 fL — ABNORMAL LOW (ref 80.0–100.0)
Monocytes Absolute: 0.5 10*3/uL (ref 0.1–1.0)
Monocytes Relative: 4 %
Neutro Abs: 10.7 10*3/uL — ABNORMAL HIGH (ref 1.7–7.7)
Neutrophils Relative %: 81 %
Platelets: 293 10*3/uL (ref 150–400)
RBC: 4.73 MIL/uL (ref 3.87–5.11)
RDW: 13.9 % (ref 11.5–15.5)
WBC: 13.3 10*3/uL — ABNORMAL HIGH (ref 4.0–10.5)
nRBC: 0 % (ref 0.0–0.2)

## 2022-09-24 MED ORDER — SCOPOLAMINE 1 MG/3DAYS TD PT72: 1.0000 | MEDICATED_PATCH | TRANSDERMAL | 12 refills | Status: DC

## 2022-09-24 MED ORDER — LACTATED RINGERS IV BOLUS
1000.0000 mL | Freq: Once | INTRAVENOUS | Status: AC
  Administered 2022-09-24: 1000 mL via INTRAVENOUS

## 2022-09-24 MED ORDER — ONDANSETRON HCL 4 MG PO TABS: 4.0000 mg | ORAL_TABLET | Freq: Three times a day (TID) | ORAL | 1 refills | Status: DC | PRN

## 2022-09-24 MED ORDER — SODIUM CHLORIDE 0.9 % IV SOLN
8.0000 mg | Freq: Once | INTRAVENOUS | Status: AC
  Administered 2022-09-24: 8 mg via INTRAVENOUS
  Filled 2022-09-24: qty 4

## 2022-09-24 MED ORDER — FAMOTIDINE IN NACL 20-0.9 MG/50ML-% IV SOLN
20.0000 mg | Freq: Once | INTRAVENOUS | Status: AC
  Administered 2022-09-24: 20 mg via INTRAVENOUS
  Filled 2022-09-24: qty 50

## 2022-09-24 MED ORDER — SCOPOLAMINE 1 MG/3DAYS TD PT72
1.0000 | MEDICATED_PATCH | TRANSDERMAL | Status: DC
  Administered 2022-09-24: 1.5 mg via TRANSDERMAL
  Filled 2022-09-24: qty 1

## 2022-09-24 MED ORDER — ONDANSETRON 4 MG PO TBDP
8.0000 mg | ORAL_TABLET | Freq: Once | ORAL | Status: AC
  Administered 2022-09-24: 8 mg via ORAL
  Filled 2022-09-24: qty 2

## 2022-09-24 NOTE — MAU Note (Signed)
Priscilla [name] is a 27 y.o. at 84w1dhere in MAU reporting: states she was here last night and is still feeling dehydrated. Pt denies vomiting and is able to keep down fluids.   Onset of complaint: ongoing  Pain score: 0/10  Vitals:   09/24/22 1813  BP: (!) 107/54  Pulse: 65  Resp: 16  Temp: 98.8 F (37.1 C)  SpO2: 99%     FHT:155  Lab orders placed from triage: UA

## 2022-09-24 NOTE — MAU Provider Note (Addendum)
History     CSN: [id number]  Arrival date and time: 09/23/22 2257   Event Date/Time   First Provider Initiated Contact with Patient 09/23/22 2345      Chief Complaint  Patient presents with   Nausea   Priscilla [name] is a 27 y.o. [id number] at 26w1dwho presents today with nausea and vomiting. Priscilla [name] states that Priscilla [name] has vomited about 40 times today. Priscilla [name] has tried phenergan and Zofran today, but neither have helped. Priscilla [name] denies any fever, sick contacts or diarrhea. Priscilla [name] denies any cramping or VB. [name] is [facility]  Emesis  This is a new problem. The current episode started today. The problem occurs more than 10 times per day. The problem has been unchanged. The emesis has an appearance of stomach contents. There has been no fever. Risk factors: pregnancy. Treatments tried: phenergan and zofran. The treatment provided no relief.    OB History     Gravida  5   Para  2   Term  2   Preterm      AB  2   Living  2      SAB  2   IAB      Ectopic      Multiple  0   Live Births  2           Past Medical History:  Diagnosis Date   Anemia    Headache    Infection    UTI    Past Surgical History:  Procedure Laterality Date   NO PAST SURGERIES     WISDOM TOOTH EXTRACTION      Family History  Problem Relation Age of Onset   Colon cancer Mother    Healthy Father    Asthma Sister    Diabetes Maternal Grandmother    COPD Neg Hx    Hypertension Neg Hx    Stroke Neg Hx     Social History   Tobacco Use   Smoking status: Every Day    Packs/day: 0.25    Years: 1.00    Total pack years: 0.25    Types: Cigarettes   Smokeless tobacco: Never   Tobacco comments:    2-3 cigs per day  Vaping Use   Vaping Use: Never used  Substance Use Topics   Alcohol use: Not Currently    Comment: occasionally, prior to pregnancy   Drug use: Not Currently    Types: Marijuana    Comment: daily, 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   09/22/2022   ondansetron (ZOFRAN) 4 MG tablet Take 1 tablet (4 mg total) by mouth every 8 (eight) hours as needed for nausea or vomiting. 20 tablet 0 09/23/2022 at 0800   Prenatal-DSS-FeCb-FeGl-FA (CITRANATAL BLOOM) 90-1 MG TABS Take 1 tablet by mouth daily. 30 tablet 11 09/23/2022 at 0800   acetaminophen (TYLENOL) 325 MG tablet Take 650 mg by mouth every 6 (six) hours as needed.      Blood Pressure Monitoring (BLOOD PRESSURE KIT) DEVI 1 kit by Does not apply route once a week.  1 each 0    Blood Pressure Monitoring (BLOOD PRESSURE KIT) DEVI 1 kit by Does not apply route once a week. 1 each 0    diclofenac (VOLTAREN) 75 MG EC tablet Take 1 tablet (75 mg total) by mouth 2 (two) times daily as needed. (Patient not taking: Reported on 08/17/2022) 14 tablet 0    Doxylamine-Pyridoxine (DICLEGIS) 10-10 MG TBEC Take 2 tabs qhs then add 1 tab A.M and midday prn 90 tablet 2    Misc. Devices (GOJJI WEIGHT SCALE) MISC 1 Device by Does not apply route every 30 (thirty) days. 1 each 0    ondansetron (ZOFRAN-ODT) 8 MG disintegrating tablet Take 1 tablet (8 mg total) by mouth every 8 (eight) hours as needed for nausea or vomiting. (Patient not taking: Reported on 08/17/2022) 20 tablet 0    Prenat-Fe Poly-Methfol-FA-DHA (VITAFOL ULTRA) 29-0.6-0.4-200 MG CAPS Take 1 tablet by mouth daily. 30 capsule 11    Prenatal Vit-FePoly-FA-DHA (VITAFOL-ONE) 29-1-200 MG CAPS Take 1 capsule by mouth daily. (Patient not taking: Reported on 08/17/2022) 30 capsule 11    promethazine (PHENERGAN) 25 MG tablet Take 1 tablet (25 mg total) by mouth every 6 (six) hours  as needed for nausea or vomiting. 30 tablet 2    rizatriptan (MAXALT) 5 MG tablet Take 2 tablets (10 mg total) by mouth once as needed for migraine. May repeat in 2 hours if needed (Patient not taking: Reported on 04/04/2022) 30 tablet 2    senna-docusate (SENOKOT-S) 8.6-50 MG tablet Take 1 tablet by mouth at bedtime as needed for mild constipation. 20 tablet 0     Review of Systems  Gastrointestinal:  Positive for vomiting.  All other systems reviewed and are negative.  Physical Exam   Blood pressure 113/62, pulse 89, temperature 98.5 F (36.9 C), temperature source Oral, resp. rate 17, height '5\' 1"'$  (1.549 m), weight 77.3 kg, last menstrual period 06/17/2022, SpO2 100 %, unknown if currently breastfeeding.  Physical Exam Constitutional:      Appearance: Priscilla [name] is well-developed.  HENT:     Head: Normocephalic.  Eyes:     Pupils: Pupils are equal, round, and reactive to light.  Cardiovascular:     Rate and Rhythm: Normal rate.  Pulmonary:     Effort: Pulmonary effort is normal. No respiratory distress.  Abdominal:     Palpations: Abdomen is soft.     Tenderness: There is no abdominal tenderness.  Genitourinary:    Vagina: No bleeding. Vaginal discharge: mucusy.    Comments: External: no lesion Vagina: small amount of white discharge     Musculoskeletal:        General: Normal range of motion.     Cervical back: Normal range of motion and neck supple.  Skin:    General: Skin is warm and dry.  Neurological:     Mental Status: Priscilla [name] is alert and oriented to person, place, and time.  Psychiatric:        Mood and Affect: Mood normal.        Behavior: Behavior normal.     Results for orders placed or performed during the hospital encounter of 09/23/22 (from the past 24 hour(s))  Urinalysis, Routine w reflex microscopic     Status: Abnormal   Collection Time: 09/23/22 11:32 PM  Result Value Ref Range   Color, Urine YELLOW YELLOW   APPearance HAZY (A) CLEAR   Specific Gravity,  Urine 1.015 1.005 - 1.030   pH 5.0 5.0 - 8.0   Glucose, UA  NEGATIVE NEGATIVE mg/dL   Hgb urine dipstick NEGATIVE NEGATIVE   Bilirubin Urine NEGATIVE NEGATIVE   Ketones, ur 80 (A) NEGATIVE mg/dL   Protein, ur NEGATIVE NEGATIVE mg/dL   Nitrite NEGATIVE NEGATIVE   Leukocytes,Ua SMALL (A) NEGATIVE   RBC / HPF 0-5 0 - 5 RBC/hpf   WBC, UA 0-5 0 - 5 WBC/hpf   Bacteria, UA RARE (A) NONE SEEN   Squamous Epithelial / HPF 11-20 0 - 5 /HPF   Mucus PRESENT   CBC with Differential/Platelet     Status: Abnormal   Collection Time: 09/24/22 12:39 AM  Result Value Ref Range   WBC 13.3 (H) 4.0 - 10.5 K/uL   RBC 4.73 3.87 - 5.11 MIL/uL   Hemoglobin 12.9 12.0 - 15.0 g/dL   HCT 37.6 36.0 - 46.0 %   MCV 79.5 (L) 80.0 - 100.0 fL   MCH 27.3 26.0 - 34.0 pg   MCHC 34.3 30.0 - 36.0 g/dL   RDW 13.9 11.5 - 15.5 %   Platelets 293 150 - 400 K/uL   nRBC 0.0 0.0 - 0.2 %   Neutrophils Relative % 81 %   Neutro Abs 10.7 (H) 1.7 - 7.7 K/uL   Lymphocytes Relative 15 %   Lymphs Abs 2.0 0.7 - 4.0 K/uL   Monocytes Relative 4 %   Monocytes Absolute 0.5 0.1 - 1.0 K/uL   Eosinophils Relative 0 %   Eosinophils Absolute 0.0 0.0 - 0.5 K/uL   Basophils Relative 0 %   Basophils Absolute 0.1 0.0 - 0.1 K/uL   Immature Granulocytes 0 %   Abs Immature Granulocytes 0.03 0.00 - 0.07 K/uL  Comprehensive metabolic panel     Status: Abnormal   Collection Time: 09/24/22 12:39 AM  Result Value Ref Range   Sodium 133 (L) 135 - 145 mmol/L   Potassium 3.3 (L) 3.5 - 5.1 mmol/L   Chloride 103 98 - 111 mmol/L   CO2 20 (L) 22 - 32 mmol/L   Glucose, Bld 113 (H) 70 - 99 mg/dL   BUN <5 (L) 6 - 20 mg/dL   Creatinine, Ser 0.65 0.44 - 1.00 mg/dL   Calcium 8.9 8.9 - 10.3 mg/dL   Total Protein 7.3 6.5 - 8.1 g/dL   Albumin 3.7 3.5 - 5.0 g/dL   AST 18 15 - 41 U/L   ALT 12 0 - 44 U/L   Alkaline Phosphatase 47 38 - 126 U/L   Total Bilirubin 0.7 0.3 - 1.2 mg/dL   GFR, Estimated >60 >60 mL/min   Anion gap 10 5 - 15    MAU Course   Procedures  MDM CBC/CMET pending  Patient given 2L LR bolus  Pepcid IV, Zofran IV given  Scopolamine patch ordered to be applied while here  0100 Care turned over to Maryagnes Amos, CNM   Marcille Buffy DNP, CNM  09/24/22  12:53 AM   Labs reassuring. No further episodes of emesis. Patient sleeping for reassessments. Will send scopolamine rx to pharmacy. Assessment and Plan  [redacted] weeks gestation of pregnancy Nausea/vomiting in pregnancy  - Discharge home in stable condition  - Rx for Scopolamine - Return precautions given. Return to MAU as needed for new/worsening symptoms - Keep OB appointment as scheduled   Renee Harder, CNM 09/24/22, 4:54 AM

## 2022-09-24 NOTE — MAU Provider Note (Signed)
History    Priscilla [name] is a 27 y.o. [id number] at 77w1dwho receives care at [facility].  She presents today for Dehydration.  She states she was seen last night and given LR and nausea medications.  She reports the nausea has improved, but despite drinking water all day she reports continued dry mouth and feeling weak.  Patient denies pain or other issues.    CSN: [id number] Arrival date and time: 09/24/22 1640   None     Chief Complaint  Patient presents with   Dehydration    OB History     Gravida  5   Para  2   Term  2   Preterm      AB  2   Living  2      SAB  2   IAB      Ectopic      Multiple  0   Live Births  2           Past Medical History:  Diagnosis Date   Anemia    Headache    Infection    UTI    Past Surgical History:  Procedure Laterality Date   NO PAST SURGERIES     WISDOM TOOTH EXTRACTION      Family History  Problem Relation Age of Onset   Colon cancer Mother    Healthy Father    Asthma Sister    Diabetes Maternal Grandmother    COPD Neg Hx    Hypertension Neg Hx    Stroke Neg Hx     Social History   Tobacco Use   Smoking status: Every Day    Packs/day: 0.25    Years: 1.00    Total pack years: 0.25    Types: Cigarettes   Smokeless tobacco: Never   Tobacco comments:    2-3 cigs per day  Vaping Use   Vaping Use: Never used  Substance Use Topics   Alcohol use: Not Currently    Comment: occasionally, prior to pregnancy   Drug use: Not Currently    Types: Marijuana    Comment: daily, 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   09/23/2022    Prenatal-DSS-FeCb-FeGl-FA (CITRANATAL BLOOM) 90-1 MG TABS Take 1 tablet by mouth daily. 30 tablet 11 09/23/2022   acetaminophen (TYLENOL) 325 MG tablet Take 650 mg by mouth every 6 (six) hours as needed.      Blood Pressure Monitoring (BLOOD PRESSURE KIT) DEVI 1 kit by Does not apply route once a week. 1 each 0    Blood Pressure Monitoring (BLOOD PRESSURE KIT) DEVI 1 kit by Does not apply route once a week. 1 each 0    diclofenac (VOLTAREN) 75 MG EC tablet Take 1 tablet (75 mg total) by mouth 2 (two) times daily as needed. (Patient not taking: Reported on 08/17/2022) 14 tablet 0    Doxylamine-Pyridoxine (DICLEGIS) 10-10 MG TBEC Take 2 tabs qhs then add 1 tab A.M and midday prn 90 tablet 2    Misc. Devices (GOJJI WEIGHT SCALE) MISC 1 Device by Does not apply route every 30 (thirty)  days. 1 each 0    ondansetron (ZOFRAN) 4 MG tablet Take 1 tablet (4 mg total) by mouth every 8 (eight) hours as needed for nausea or vomiting. 20 tablet 0    ondansetron (ZOFRAN-ODT) 8 MG disintegrating tablet Take 1 tablet (8 mg total) by mouth every 8 (eight) hours as needed for nausea or vomiting. (Patient not taking: Reported on 08/17/2022) 20 tablet 0    Prenat-Fe Poly-Methfol-FA-DHA (VITAFOL ULTRA) 29-0.6-0.4-200 MG CAPS Take 1 tablet by mouth daily. 30 capsule 11    Prenatal Vit-FePoly-FA-DHA (VITAFOL-ONE) 29-1-200 MG CAPS Take 1 capsule by mouth daily. (Patient not taking: Reported on 08/17/2022) 30 capsule 11    promethazine (PHENERGAN) 25 MG tablet Take 1 tablet (25 mg total) by mouth every 6 (six) hours as needed for nausea or vomiting. 30 tablet 2    rizatriptan (MAXALT) 5 MG tablet Take 2 tablets (10 mg total) by mouth once as needed for migraine. May repeat in 2 hours if needed (Patient not taking: Reported on 04/04/2022) 30 tablet 2    [START ON 09/27/2022] scopolamine (TRANSDERM-SCOP) 1 MG/3DAYS Place 1 patch (1.5 mg total) onto the skin every 3 (three) days. 10 patch 12    senna-docusate (SENOKOT-S) 8.6-50 MG tablet  Take 1 tablet by mouth at bedtime as needed for mild constipation. 20 tablet 0     Review of Systems  Constitutional:  Negative for chills and fever.  Genitourinary:  Negative for difficulty urinating, dysuria, vaginal bleeding and vaginal discharge.  Neurological:  Negative for dizziness, light-headedness and headaches.   Physical Exam   Blood pressure (!) 106/51, pulse 94, temperature 98.8 F (37.1 C), temperature source Oral, resp. rate 16, height '5\' 1"'$  (1.549 m), weight 78 kg, last menstrual period 06/17/2022, SpO2 99 %, unknown if currently breastfeeding.  Physical Exam Vitals reviewed.  Constitutional:      Appearance: She is obese.  HENT:     Head: Normocephalic and atraumatic.  Eyes:     Conjunctiva/sclera: Conjunctivae normal.  Cardiovascular:     Rate and Rhythm: Normal rate.  Pulmonary:     Effort: Pulmonary effort is normal. No respiratory distress.  Abdominal:     Palpations: Abdomen is soft.     Tenderness: There is no abdominal tenderness.  Musculoskeletal:        General: Normal range of motion.     Cervical back: Normal range of motion.  Skin:    General: Skin is warm and dry.  Neurological:     Mental Status: She is alert and oriented to person, place, and time.  Psychiatric:        Mood and Affect: Mood normal.        Behavior: Behavior normal.    MAU Course  Procedures Results for orders placed or performed during the hospital encounter of 09/24/22 (from the past 24 hour(s))  Urinalysis, Routine w reflex microscopic Urine, Clean Catch     Status: Abnormal   Collection Time: 09/24/22  6:35 PM  Result Value Ref Range   Color, Urine YELLOW YELLOW   APPearance HAZY (A) CLEAR   Specific Gravity, Urine 1.018 1.005 - 1.030   pH 6.0 5.0 - 8.0   Glucose, UA NEGATIVE NEGATIVE mg/dL   Hgb urine dipstick NEGATIVE NEGATIVE   Bilirubin Urine NEGATIVE NEGATIVE   Ketones, ur 80 (A) NEGATIVE mg/dL   Protein, ur NEGATIVE NEGATIVE mg/dL   Nitrite NEGATIVE  NEGATIVE   Leukocytes,Ua LARGE (A) NEGATIVE   RBC / HPF 0-5 0 - 5  RBC/hpf   WBC, UA 11-20 0 - 5 WBC/hpf   Bacteria, UA FEW (A) NONE SEEN   Squamous Epithelial / HPF 11-20 0 - 5 /HPF   Mucus PRESENT     MDM Start IV LR Bolus Antiemetic Assessment and Plan  27 year old, [id number]  SIUP at 14.1 weeks Dehydration  -Reviewed POC with patient. -Informed of UA findings.  -Will send for culture for further evaluation. -Start IV and give fluids. -Monitor and reassess.   Maryann Conners 09/24/2022, 8:32 PM   Reassessment (10:12 PM) -Patient s/p 2nd bag fluids. -Reports some nausea. Initially declines medication, but agreeable to oral dosing. -Give Zofran '8mg'$  orally now. -Reviewed eating habits during pregnancy. -Informed that lack of intake can also contribute to feeling weak.  -Encouraged high protein snacks and meals. -Patient offered and declines snack tray. -Encouraged to call primary office or return to MAU if symptoms worsen or with the onset of new symptoms. -Discharged to home in stable condition.  Maryann Conners MSN, CNM Advanced Practice Provider, [facility]

## 2022-09-25 LAB — CULTURE, OB URINE

## 2022-10-04 ENCOUNTER — Encounter: Payer: Medicaid Other | Admitting: Obstetrics and Gynecology

## 2022-10-08 NOTE — Progress Notes (Signed)
   PRENATAL VISIT NOTE  Subjective:  Priscilla [name] is a 27 y.o. [phone] at 48w1dbeing seen today for ongoing prenatal care.  She is currently monitored for the following issues for this low-risk pregnancy and has STRABISMUS; ALLERGIC RHINITIS; Normal delivery; Bacterial vaginitis; Indication for care in labor or delivery; Vacuum-assisted vaginal delivery; Hemorrhagic cyst of ovary; Migraines; Healthcare maintenance; Lipoma of back; Miscarriage; and Supervision of other normal pregnancy, antepartum on their problem list.  Patient reports no complaints.  Contractions: Not present. Vag. Bleeding: None.  Movement: Present (flutters). Denies leaking of fluid.   The following portions of the patient's history were reviewed and updated as appropriate: allergies, current medications, past family history, past medical history, past social history, past surgical history and problem list.   Objective:   Vitals:   10/09/22 1534  BP: 110/71  Pulse: 89  Weight: 168 lb 12.8 oz (76.6 kg)    Fetal Status: Fetal Heart Rate (bpm): 147   Movement: Present (flutters)     General:  Alert, oriented and cooperative. Patient is in no acute distress.  Skin: Skin is warm and dry. No rash noted.   Cardiovascular: Normal heart rate noted  Respiratory: Normal respiratory effort, no problems with respiration noted  Abdomen: Soft, gravid, appropriate for gestational age.  Pain/Pressure: Present     Pelvic: Cervical exam deferred        Extremities: Normal range of motion.  Edema: None  Mental Status: Normal mood and affect. Normal behavior. Normal judgment and thought content.   Assessment and Plan:  Pregnancy: [id number] 150w1d. Supervision of other normal pregnancy, antepartum No acute concerns today. Starting to feel flutters.   2. [redacted] weeks gestation of pregnancy - [place] OB COMP + 14 WK; Future  Preterm labor symptoms and general obstetric precautions including but not limited to vaginal bleeding,  contractions, leaking of fluid and fetal movement were reviewed in detail with the patient. Please refer to After Visit Summary for other counseling recommendations.    Future Appointments  Date Time Provider [place]/2024  9:55 AM HaShelly BombardMD [facility]     SiGerlene FeeDO

## 2022-10-09 ENCOUNTER — Encounter: Payer: Self-pay | Admitting: Family Medicine

## 2022-10-09 ENCOUNTER — Ambulatory Visit (INDEPENDENT_AMBULATORY_CARE_PROVIDER_SITE_OTHER): Payer: Medicaid Other | Admitting: Family Medicine

## 2022-10-09 VITALS — BP 110/71 | HR 89 | Wt 168.8 lb

## 2022-10-09 DIAGNOSIS — Z3482 Encounter for supervision of other normal pregnancy, second trimester: Secondary | ICD-10-CM

## 2022-10-09 DIAGNOSIS — Z3A16 16 weeks gestation of pregnancy: Secondary | ICD-10-CM

## 2022-10-09 DIAGNOSIS — Z3480 Encounter for supervision of other normal pregnancy, unspecified trimester: Secondary | ICD-10-CM

## 2022-10-09 NOTE — Progress Notes (Signed)
Patient presents for [place] visit. No concerns at this time.

## 2022-10-10 ENCOUNTER — Other Ambulatory Visit: Payer: Self-pay

## 2022-10-10 DIAGNOSIS — Z3480 Encounter for supervision of other normal pregnancy, unspecified trimester: Secondary | ICD-10-CM

## 2022-11-06 ENCOUNTER — Encounter: Payer: Self-pay | Admitting: Obstetrics

## 2022-11-06 ENCOUNTER — Telehealth (INDEPENDENT_AMBULATORY_CARE_PROVIDER_SITE_OTHER): Payer: Medicaid Other | Admitting: Obstetrics

## 2022-11-06 VITALS — BP 125/71 | HR 108 | Wt 175.0 lb

## 2022-11-06 DIAGNOSIS — Z3482 Encounter for supervision of other normal pregnancy, second trimester: Secondary | ICD-10-CM

## 2022-11-06 DIAGNOSIS — Z3480 Encounter for supervision of other normal pregnancy, unspecified trimester: Secondary | ICD-10-CM

## 2022-11-06 DIAGNOSIS — Z3A19 19 weeks gestation of pregnancy: Secondary | ICD-10-CM

## 2022-11-06 NOTE — Progress Notes (Signed)
Subjective:  Priscilla [name] is a 27 y.o. [phone] at 96w1dbeing seen today for ongoing prenatal care.  She is currently monitored for the following issues for this low-risk pregnancy and has STRABISMUS; ALLERGIC RHINITIS; Normal delivery; Bacterial vaginitis; Indication for care in labor or delivery; Vacuum-assisted vaginal delivery; Hemorrhagic cyst of ovary; Migraines; Healthcare maintenance; Lipoma of back; Miscarriage; and Supervision of other normal pregnancy, antepartum on their problem list.  Patient reports heartburn.  Contractions: Irritability. Vag. Bleeding: None.   . Denies leaking of fluid.   The following portions of the patient's history were reviewed and updated as appropriate: allergies, current medications, past family history, past medical history, past social history, past surgical history and problem list. Problem list updated.  Objective:   Vitals:   11/06/22 1315  BP: 125/71  Pulse: (!) 108  Weight: 175 lb (79.4 kg)    Fetal Status: Fetal Heart Rate (bpm): 160         General:  Alert, oriented and cooperative. Patient is in no acute distress.  Skin: Skin is warm and dry. No rash noted.   Cardiovascular: Normal heart rate noted  Respiratory: Normal respiratory effort, no problems with respiration noted  Abdomen: Soft, gravid, appropriate for gestational age. Pain/Pressure: Absent     Pelvic:  Cervical exam deferred        Extremities: Normal range of motion.  Edema: None  Mental Status: Normal mood and affect. Normal behavior. Normal judgment and thought content.   Urinalysis:      Assessment and Plan:  Pregnancy: [id number] 174w1d1. Supervision of other normal pregnancy, antepartum   Preterm labor symptoms and general obstetric precautions including but not limited to vaginal bleeding, contractions, leaking of fluid and fetal movement were reviewed in detail with the patient. Please refer to After Visit Summary for other counseling recommendations.    Return in about 4 weeks (around 12/04/2022) for ROB.   HaShelly BombardMD 11/06/22

## 2022-11-07 ENCOUNTER — Ambulatory Visit: Payer: Medicaid Other

## 2022-11-07 ENCOUNTER — Ambulatory Visit: Payer: Medicaid Other | Attending: Obstetrics and Gynecology

## 2022-11-07 ENCOUNTER — Encounter: Payer: Self-pay | Admitting: *Deleted

## 2022-11-07 ENCOUNTER — Ambulatory Visit: Payer: Medicaid Other | Admitting: *Deleted

## 2022-11-07 VITALS — BP 108/51 | HR 101

## 2022-11-07 DIAGNOSIS — O99212 Obesity complicating pregnancy, second trimester: Secondary | ICD-10-CM | POA: Insufficient documentation

## 2022-11-07 DIAGNOSIS — Z3A19 19 weeks gestation of pregnancy: Secondary | ICD-10-CM | POA: Insufficient documentation

## 2022-11-07 DIAGNOSIS — Z363 Encounter for antenatal screening for malformations: Secondary | ICD-10-CM | POA: Insufficient documentation

## 2022-11-07 DIAGNOSIS — Z3480 Encounter for supervision of other normal pregnancy, unspecified trimester: Secondary | ICD-10-CM | POA: Diagnosis not present

## 2022-11-08 ENCOUNTER — Other Ambulatory Visit: Payer: Self-pay | Admitting: *Deleted

## 2022-11-08 DIAGNOSIS — O99212 Obesity complicating pregnancy, second trimester: Secondary | ICD-10-CM

## 2022-11-08 DIAGNOSIS — O283 Abnormal ultrasonic finding on antenatal screening of mother: Secondary | ICD-10-CM

## 2022-11-08 DIAGNOSIS — Z362 Encounter for other antenatal screening follow-up: Secondary | ICD-10-CM

## 2022-12-04 ENCOUNTER — Telehealth (INDEPENDENT_AMBULATORY_CARE_PROVIDER_SITE_OTHER): Payer: Medicaid Other | Admitting: Obstetrics and Gynecology

## 2022-12-04 ENCOUNTER — Encounter: Payer: Self-pay | Admitting: Obstetrics and Gynecology

## 2022-12-04 VITALS — BP 120/60

## 2022-12-04 DIAGNOSIS — Z3482 Encounter for supervision of other normal pregnancy, second trimester: Secondary | ICD-10-CM

## 2022-12-04 DIAGNOSIS — Z3480 Encounter for supervision of other normal pregnancy, unspecified trimester: Secondary | ICD-10-CM

## 2022-12-04 DIAGNOSIS — Z3A23 23 weeks gestation of pregnancy: Secondary | ICD-10-CM

## 2022-12-04 MED ORDER — PROMETHAZINE HCL 25 MG PO TABS: 25.0000 mg | ORAL_TABLET | Freq: Four times a day (QID) | ORAL | 2 refills | Status: DC | PRN

## 2022-12-04 NOTE — Progress Notes (Signed)
OBSTETRICS PRENATAL VIRTUAL VISIT ENCOUNTER NOTE  Provider location: [facility] [place] [facility]   Patient location: Home  I connected with Priscilla [name] on 12/04/22 at  2:50 PM EDT by MyChart Video Encounter and verified that I am speaking with the correct person using two identifiers. I discussed the limitations, risks, security and privacy concerns of performing an evaluation and management service virtually and the availability of in person appointments. I also discussed with the patient that there may be a patient responsible charge related to this service. The patient expressed understanding and agreed to proceed. Subjective:  Priscilla T Tallerico is a 27 y.o. [phone] at [redacted]w[redacted]d being seen today for ongoing prenatal care.  She is currently monitored for the following issues for this low-risk pregnancy and has STRABISMUS; ALLERGIC RHINITIS; Normal delivery; Bacterial vaginitis; Indication for care in labor or delivery; Vacuum-assisted vaginal delivery; Hemorrhagic cyst of ovary; Migraines; Healthcare maintenance; Lipoma of back; Miscarriage; and Supervision of other normal pregnancy, antepartum on their problem list.  Patient reports no complaints.  Contractions: Not present. Vag. Bleeding: None.  Movement: Present. Denies any leaking of fluid.   The following portions of the patient's history were reviewed and updated as appropriate: allergies, current medications, past family history, past medical history, past social history, past surgical history and problem list.   Objective:   Vitals:   12/04/22 1431  BP: 120/60    Fetal Status:     Movement: Present     General:  Alert, oriented and cooperative. Patient is in no acute distress.  Respiratory: Normal respiratory effort, no problems with respiration noted  Mental Status: Normal mood and affect. Normal behavior. Normal judgment and thought content.  Rest of physical exam deferred due to type of encounter  Imaging: [place]  MFM OB DETAIL +14 WK  Result Date: 11/07/2022 ----------------------------------------------------------------------  OBSTETRICS REPORT                       (Signed Final 11/07/2022 04:02 pm) ---------------------------------------------------------------------- Patient Info  ID #:       [id number]                          D.O.B.:  01-31-1996 (26 yrs)  Name:       Priscilla T Koslosky                 Visit Date: 11/07/2022 01:24 pm ---------------------------------------------------------------------- Performed By  Attending:        Johnell Comings MD         Ref. Address:     [address]                                                             [place], [place]  Performed By:     Germain Osgood            [facility]  [facility]                                                             [facility]                                                             [facility]  Referred By:      Chancy Milroy                    MD ---------------------------------------------------------------------- Orders  #  Description                           Code        Ordered By  1  [place] MFM OB DETAIL +14 [place]               [id number]    Arlina Robes ----------------------------------------------------------------------  #  Order #                     Accession #                Episode #  1  [id number]                   [id number]                 [id number] ---------------------------------------------------------------------- Indications  Obesity complicating pregnancy, second         O99.212  trimester (BMI 33)  Encounter for antenatal screening for          Z36.3  malformations  LR NIPS, Neg [place]  [redacted] weeks gestation of pregnancy                Z3A.19 ---------------------------------------------------------------------- Fetal Evaluation  Num Of Fetuses:         1  Fetal Heart Rate(bpm):  157   Cardiac Activity:       Observed  Presentation:           Cephalic  Placenta:               Posterior Fundal  P. Cord Insertion:      Visualized, central  Amniotic Fluid  AFI FV:      Within normal limits                              Largest Pocket(cm)                              4.84 ---------------------------------------------------------------------- Biometry  BPD:      43.6  mm  G. Age:  19w 1d         80  %    CI:        73.33   %    70 - 86                                                          FL/HC:      17.9   %    16.1 - 18.3  HC:      161.8  mm     G. Age:  19w 0d         27  %    HC/AC:      1.13        1.09 - 1.39  AC:      143.6  mm     G. Age:  19w 5d         59  %    FL/BPD:     66.5   %  FL:         29  mm     G. Age:  18w 6d         30  %    FL/AC:      20.2   %    20 - 24  HUM:      28.5  mm     G. Age:  19w 1d         49  %  CER:      19.7  mm     G. Age:  19w 1d         41  %  LV:        6.7  mm  CM:        4.6  mm  Est. FW:     284  gm    0 lb 10 oz      45  % ---------------------------------------------------------------------- OB History  Gravidity:    5         Term:   2         SAB:   2  Living:       2 ---------------------------------------------------------------------- Gestational Age  LMP:           20w 3d        Date:  06/17/22                  EDD:   03/24/23  U/S Today:     19w 1d                                        EDD:   04/02/23  Best:          19w 2d     Det. ByLoman Chroman         EDD:   04/01/23                                      (08/08/22) ---------------------------------------------------------------------- Anatomy  Cranium:               Appears normal  Aortic Arch:            Appears normal  Cavum:                 Appears normal         Ductal Arch:            Appears normal  Ventricles:            Appears normal         Diaphragm:              Appears normal  Choroid Plexus:        Appears normal         Stomach:                Appears normal, left                                                                         sided  Cerebellum:            Appears normal         Abdomen:                Appears normal  Posterior Fossa:       Appears normal         Abdominal Wall:         Appears nml (cord                                                                        insert, abd wall)  Nuchal Fold:           Appears normal         Cord Vessels:           Appears normal (3                                                                        vessel cord)  Face:                  Orbits nl; profile     Kidneys:                Appear normal                         limited  Lips:                  Appears normal         Bladder:                Appears normal  Thoracic:              Appears normal  Spine:                  Appears normal  Heart:                 Not well visualized,   Upper Extremities:      Appears normal                         EIF  RVOT:                  Appears normal         Lower Extremities:      Appears normal  LVOT:                  Appears normal  Other:  Heels/feet and open hands/5th digits visualized. VC, 3VV visualized.          Nasal bone, lenses, maxilla, mandible and falx visualized. Fetus          appears to be female. Technically difficult due to maternal habitus. ---------------------------------------------------------------------- Cervix Uterus Adnexa  Cervix  Length:              3  cm.  Normal appearance by transabdominal scan  Uterus  No abnormality visualized.  Right Ovary  Not visualized.  Left Ovary  Not visualized.  Cul De Sac  No free fluid seen.  Adnexa  No adnexal mass visualized ---------------------------------------------------------------------- Comments  This patient was seen for a detailed fetal anatomy scan due  to maternal obesity with a BMI of 32.  She denies any significant past medical history and denies  any problems in her current pregnancy.  She had a cell free DNA test earlier in her pregnancy which   indicated a low risk for trisomy 63, 68, and 13. A female fetus  is predicted.  She was informed that the fetal growth and amniotic fluid  level were appropriate for her gestational age.  On today's exam, an intracardiac echogenic focus was noted  in the left ventricle of the fetal heart.  The small association between an echogenic focus and  Down syndrome was discussed.  Due to the echogenic focus noted today, the patient was  offered and declined an amniocentesis today for definitive  diagnosis of fetal aneuploidy.  She reports that she is  comfortable with her negative cell free DNA test.  The views of the fetal anatomy were limited today due to the  fetal position.  The patient was informed that anomalies may be missed due  to technical limitations. If the fetus is in a suboptimal position  or maternal habitus is increased, visualization of the fetus in  the maternal uterus may be impaired.  A follow-up exam was scheduled in 4 weeks to complete the  views of the fetal anatomy. ----------------------------------------------------------------------                   Johnell Comings, MD Electronically Signed Final Report   11/07/2022 04:02 pm ----------------------------------------------------------------------   Assessment and Plan:  Pregnancy: [id number] at [redacted]w[redacted]d 1. Supervision of other normal pregnancy, antepartum Patient is doing well  Third trimester labs next visit Patient encouraged to increase fluid intake to overcome episodes of dizziness and to have a snack with her at all times  Preterm labor symptoms and general obstetric precautions including but not limited to vaginal bleeding, contractions, leaking of fluid and fetal movement were reviewed in detail with the patient. I discussed the assessment  and treatment plan with the patient. The patient was provided an opportunity to ask questions and all were answered. The patient agreed with the plan and demonstrated an understanding of the instructions.  The patient was advised to call back or seek an in-person office evaluation/go to MAU at [facility] for any urgent or concerning symptoms. Please refer to After Visit Summary for other counseling recommendations.   I provided 15 minutes of face-to-face time during this encounter.  No follow-ups on file.  Future Appointments  Date Time Provider [place]  12/04/2022  2:50 PM Netanya Yazdani, Vickii Chafe, MD [place] None  12/05/2022  1:45 PM [facility] NURSE [facility]  12/05/2022  2:00 PM [facility] US1 [facility] [place]    Mora Bellman, MD [facility], [place]

## 2022-12-04 NOTE — Progress Notes (Signed)
I spoke with Priscilla [name] for her virtual [place] visit today at 23.[redacted] wks GA. Pt reports occ dizziness. Discussed normal physiologic changes in pregnancy. Advised changing positions more slowly and making sure to hydrate.

## 2022-12-05 ENCOUNTER — Ambulatory Visit: Payer: Medicaid Other

## 2022-12-07 ENCOUNTER — Telehealth: Payer: Self-pay

## 2022-12-07 NOTE — Telephone Encounter (Signed)
appointment

## 2022-12-17 ENCOUNTER — Encounter: Payer: Self-pay | Admitting: *Deleted

## 2022-12-17 ENCOUNTER — Ambulatory Visit: Payer: Medicaid Other | Attending: Obstetrics | Admitting: *Deleted

## 2022-12-17 ENCOUNTER — Ambulatory Visit (HOSPITAL_BASED_OUTPATIENT_CLINIC_OR_DEPARTMENT_OTHER): Payer: Medicaid Other

## 2022-12-17 VITALS — BP 126/57 | HR 101

## 2022-12-17 DIAGNOSIS — Z3A25 25 weeks gestation of pregnancy: Secondary | ICD-10-CM | POA: Insufficient documentation

## 2022-12-17 DIAGNOSIS — O35BXX0 Maternal care for other (suspected) fetal abnormality and damage, fetal cardiac anomalies, not applicable or unspecified: Secondary | ICD-10-CM

## 2022-12-17 DIAGNOSIS — O99212 Obesity complicating pregnancy, second trimester: Secondary | ICD-10-CM

## 2022-12-17 DIAGNOSIS — Z362 Encounter for other antenatal screening follow-up: Secondary | ICD-10-CM | POA: Diagnosis not present

## 2022-12-17 DIAGNOSIS — Z3480 Encounter for supervision of other normal pregnancy, unspecified trimester: Secondary | ICD-10-CM

## 2022-12-17 DIAGNOSIS — E669 Obesity, unspecified: Secondary | ICD-10-CM

## 2022-12-17 DIAGNOSIS — O283 Abnormal ultrasonic finding on antenatal screening of mother: Secondary | ICD-10-CM

## 2023-01-01 ENCOUNTER — Encounter: Payer: Self-pay | Admitting: Certified Nurse Midwife

## 2023-01-01 ENCOUNTER — Other Ambulatory Visit: Payer: Medicaid Other

## 2023-01-01 ENCOUNTER — Ambulatory Visit (INDEPENDENT_AMBULATORY_CARE_PROVIDER_SITE_OTHER): Payer: Medicaid Other | Admitting: Certified Nurse Midwife

## 2023-01-01 VITALS — BP 118/78 | HR 80 | Temp 98.8°F | Wt 181.2 lb

## 2023-01-01 DIAGNOSIS — J069 Acute upper respiratory infection, unspecified: Secondary | ICD-10-CM

## 2023-01-01 DIAGNOSIS — Z3492 Encounter for supervision of normal pregnancy, unspecified, second trimester: Secondary | ICD-10-CM

## 2023-01-01 DIAGNOSIS — Z3A27 27 weeks gestation of pregnancy: Secondary | ICD-10-CM

## 2023-01-01 DIAGNOSIS — Z3480 Encounter for supervision of other normal pregnancy, unspecified trimester: Secondary | ICD-10-CM

## 2023-01-01 DIAGNOSIS — Z3482 Encounter for supervision of other normal pregnancy, second trimester: Secondary | ICD-10-CM | POA: Diagnosis not present

## 2023-01-01 NOTE — Progress Notes (Signed)
Pt presents for ROB and 2 gtt labs.  She reports coughing and runny nose x 3 days. She has not tried OTC meds to relief sx's.

## 2023-01-01 NOTE — Patient Instructions (Signed)

## 2023-01-01 NOTE — Progress Notes (Signed)
   PRENATAL VISIT NOTE  Subjective:  Priscilla [name] is a 27 y.o. [phone] at [redacted]w[redacted]d being seen today for ongoing prenatal care.  She is currently monitored for the following issues for this low-risk pregnancy and has STRABISMUS; Allergic rhinitis; Normal delivery; Bacterial vaginitis; Indication for care in labor or delivery; Vacuum-assisted vaginal delivery; Hemorrhagic cyst of ovary; Migraines; Healthcare maintenance; Lipoma of back; Miscarriage; and Supervision of other normal pregnancy, antepartum on their problem list.  Patient reports  cough and runny nose for the last 2 days. Patient states she thought it was allergies at first but now with a cough she is unsure. She denies attempting to relieve symptoms.  .  Contractions: Irritability. Vag. Bleeding: None.  Movement: Present. Denies leaking of fluid.   The following portions of the patient's history were reviewed and updated as appropriate: allergies, current medications, past family history, past medical history, past social history, past surgical history and problem list.   Objective:   Vitals:   01/01/23 0924  BP: 118/78  Pulse: 80  Temp: 98.8 F (37.1 C)  Weight: 181 lb 3.2 oz (82.2 kg)    Fetal Status: Fetal Heart Rate (bpm): 154 Fundal Height: 27 cm Movement: Present     General:  Alert, oriented and cooperative. Patient is in no acute distress.  Skin: Skin is warm and dry. No rash noted.   Cardiovascular: Normal heart rate noted  Respiratory: Normal respiratory effort, no problems with respiration noted  Abdomen: Soft, gravid, appropriate for gestational age.  Pain/Pressure: Present     Pelvic: Cervical exam deferred        Extremities: Normal range of motion.  Edema: None  Mental Status: Normal mood and affect. Normal behavior. Normal judgment and thought content.   Assessment and Plan:  Pregnancy: [id number] at [redacted]w[redacted]d 1. Encounter for supervision of low-risk pregnancy in second trimester - Patient feeling frequent and  vigorous fetal movement.    2. [redacted] weeks gestation of pregnancy - 28 week labs completed today.  - HIV antibody (with reflex) - Glucose Tolerance, 2 Hours w/1 Hour - RPR - CBC  3. Supervision of other normal pregnancy, antepartum - HIV antibody (with reflex) - Glucose Tolerance, 2 Hours w/1 Hour - RPR - CBC  4. Upper respiratory tract infection, unspecified type - Safe medications in pregnancy list provided to patient.  - Recommended to increase oral intake and Increase rest.   Preterm labor symptoms and general obstetric precautions including but not limited to vaginal bleeding, contractions, leaking of fluid and fetal movement were reviewed in detail with the patient. Please refer to After Visit Summary for other counseling recommendations.   Return in about 2 weeks (around 01/15/2023) for LOB.  No future appointments.  Arlyn Buerkle Danella Deis) [name], MSN, CNM  [facility] for [facility]  01/01/23 3:54 PM

## 2023-01-02 LAB — CBC
Hematocrit: 30.9 % — ABNORMAL LOW (ref 34.0–46.6)
Hemoglobin: 10.3 g/dL — ABNORMAL LOW (ref 11.1–15.9)
MCH: 26.1 pg — ABNORMAL LOW (ref 26.6–33.0)
MCHC: 33.3 g/dL (ref 31.5–35.7)
MCV: 78 fL — ABNORMAL LOW (ref 79–97)
Platelets: 290 10*3/uL (ref 150–450)
RBC: 3.95 x10E6/uL (ref 3.77–5.28)
RDW: 13.5 % (ref 11.7–15.4)
WBC: 15.3 10*3/uL — ABNORMAL HIGH (ref 3.4–10.8)

## 2023-01-02 LAB — GLUCOSE TOLERANCE, 2 HOURS W/ 1HR
Glucose, 1 hour: 135 mg/dL (ref 70–179)
Glucose, 2 hour: 128 mg/dL (ref 70–152)
Glucose, Fasting: 83 mg/dL (ref 70–91)

## 2023-01-02 LAB — HIV ANTIBODY (ROUTINE TESTING W REFLEX): HIV Screen 4th Generation wRfx: NONREACTIVE

## 2023-01-02 LAB — RPR: RPR Ser Ql: NONREACTIVE

## 2023-01-15 ENCOUNTER — Encounter: Payer: Medicaid Other | Admitting: Obstetrics

## 2023-01-29 ENCOUNTER — Ambulatory Visit (INDEPENDENT_AMBULATORY_CARE_PROVIDER_SITE_OTHER): Payer: Medicaid Other | Admitting: Obstetrics

## 2023-01-29 ENCOUNTER — Encounter: Payer: Self-pay | Admitting: Obstetrics

## 2023-01-29 VITALS — BP 124/75 | HR 108 | Wt 183.9 lb

## 2023-01-29 DIAGNOSIS — Z3480 Encounter for supervision of other normal pregnancy, unspecified trimester: Secondary | ICD-10-CM

## 2023-01-29 DIAGNOSIS — J301 Allergic rhinitis due to pollen: Secondary | ICD-10-CM

## 2023-01-29 DIAGNOSIS — O99013 Anemia complicating pregnancy, third trimester: Secondary | ICD-10-CM

## 2023-01-29 MED ORDER — LORATADINE 10 MG PO TABS: 10.0000 mg | ORAL_TABLET | Freq: Every day | ORAL | 11 refills | Status: DC

## 2023-01-29 MED ORDER — ACCRUFER 30 MG PO CAPS: 1.0000 | ORAL_CAPSULE | Freq: Two times a day (BID) | ORAL | 3 refills | Status: DC

## 2023-01-29 NOTE — Progress Notes (Signed)
Subjective:  Priscilla [name] is a 27 y.o. [phone] at [redacted]w[redacted]d being seen today for ongoing prenatal care.  She is currently monitored for the following issues for this low-risk pregnancy and has STRABISMUS; Allergic rhinitis; Normal delivery; Bacterial vaginitis; Indication for care in labor or delivery; Vacuum-assisted vaginal delivery; Hemorrhagic cyst of ovary; Migraines; Healthcare maintenance; Lipoma of back; Miscarriage; and Supervision of other normal pregnancy, antepartum on their problem list.  Patient reports  seasonal allergies to pollen .  Contractions: Not present. Vag. Bleeding: None.  Movement: Present. Denies leaking of fluid.   The following portions of the patient's history were reviewed and updated as appropriate: allergies, current medications, past family history, past medical history, past social history, past surgical history and problem list. Problem list updated.  Objective:   Vitals:   01/29/23 1501  BP: 124/75  Pulse: (!) 108  Weight: 183 lb 14.4 oz (83.4 kg)    Fetal Status: Fetal Heart Rate (bpm): 147   Movement: Present     General:  Alert, oriented and cooperative. Patient is in no acute distress.  Skin: Skin is warm and dry. No rash noted.   Cardiovascular: Normal heart rate noted  Respiratory: Normal respiratory effort, no problems with respiration noted  Abdomen: Soft, gravid, appropriate for gestational age. Pain/Pressure: Present     Pelvic:  Cervical exam deferred        Extremities: Normal range of motion.  Edema: None  Mental Status: Normal mood and affect. Normal behavior. Normal judgment and thought content.   Urinalysis:      Assessment and Plan:  Pregnancy: [id number] at [redacted]w[redacted]d  1. Supervision of other normal pregnancy, antepartum  2. Anemia affecting pregnancy in third trimester Rx: - Ferric Maltol (ACCRUFER) 30 MG CAPS; Take 1 capsule (30 mg total) by mouth 2 (two) times daily before a meal. Take 2 hrs before, or 2 hrs after a meal.  Dispense:  60 capsule; Refill: 3  3. Seasonal allergic rhinitis due to pollen Rx: - loratadine (CLARITIN) 10 MG tablet; Take 1 tablet (10 mg total) by mouth daily.  Dispense: 30 tablet; Refill: 11    Preterm labor symptoms and general obstetric precautions including but not limited to vaginal bleeding, contractions, leaking of fluid and fetal movement were reviewed in detail with the patient. Please refer to After Visit Summary for other counseling recommendations.   No follow-ups on file.   Brock Bad, MD 01/29/23

## 2023-01-29 NOTE — Progress Notes (Signed)
Pt reports fetal movement with occasional pressure/pain.

## 2023-02-06 ENCOUNTER — Other Ambulatory Visit: Payer: Self-pay

## 2023-02-06 ENCOUNTER — Inpatient Hospital Stay (HOSPITAL_COMMUNITY)
Admission: AD | Admit: 2023-02-06 | Discharge: 2023-02-06 | Disposition: A | Discharge status: Home / Self Care | Payer: Medicaid Other | Attending: Obstetrics and Gynecology | Admitting: Obstetrics and Gynecology

## 2023-02-06 ENCOUNTER — Encounter (HOSPITAL_COMMUNITY): Payer: Self-pay | Admitting: Obstetrics and Gynecology

## 2023-02-06 DIAGNOSIS — O23593 Infection of other part of genital tract in pregnancy, third trimester: Secondary | ICD-10-CM | POA: Insufficient documentation

## 2023-02-06 DIAGNOSIS — O479 False labor, unspecified: Secondary | ICD-10-CM | POA: Diagnosis not present

## 2023-02-06 DIAGNOSIS — O26893 Other specified pregnancy related conditions, third trimester: Secondary | ICD-10-CM | POA: Diagnosis not present

## 2023-02-06 DIAGNOSIS — Z3A32 32 weeks gestation of pregnancy: Secondary | ICD-10-CM | POA: Insufficient documentation

## 2023-02-06 DIAGNOSIS — B9689 Other specified bacterial agents as the cause of diseases classified elsewhere: Secondary | ICD-10-CM | POA: Insufficient documentation

## 2023-02-06 DIAGNOSIS — O99333 Smoking (tobacco) complicating pregnancy, third trimester: Secondary | ICD-10-CM | POA: Insufficient documentation

## 2023-02-06 LAB — URINALYSIS, ROUTINE W REFLEX MICROSCOPIC
Bilirubin Urine: NEGATIVE
Glucose, UA: NEGATIVE mg/dL
Hgb urine dipstick: NEGATIVE
Ketones, ur: 80 mg/dL — AB
Nitrite: NEGATIVE
Protein, ur: NEGATIVE mg/dL
Specific Gravity, Urine: 1.006 (ref 1.005–1.030)
pH: 7 (ref 5.0–8.0)

## 2023-02-06 LAB — WET PREP, GENITAL
Sperm: NONE SEEN
Trich, Wet Prep: NONE SEEN
WBC, Wet Prep HPF POC: >=10 — AB (ref <10)
Yeast Wet Prep HPF POC: NONE SEEN

## 2023-02-06 MED ORDER — LACTATED RINGERS IV BOLUS
1000.0000 mL | Freq: Once | INTRAVENOUS | Status: AC
  Administered 2023-02-06: 1000 mL via INTRAVENOUS

## 2023-02-06 MED ORDER — METRONIDAZOLE 0.75 % VA GEL: 1.0000 | Freq: Every day | VAGINAL | 1 refills | Status: DC

## 2023-02-06 MED ORDER — NIFEDIPINE 10 MG PO CAPS
10.0000 mg | ORAL_CAPSULE | ORAL | Status: AC
  Administered 2023-02-06 (×3): 10 mg via ORAL
  Filled 2023-02-06 (×3): qty 1

## 2023-02-06 NOTE — MAU Provider Note (Signed)
History     CSN: [id number]  Arrival date and time: 02/06/23 1250   None     Chief Complaint  Patient presents with   Contractions   HPI Priscilla [name] is a 27 y.o. [id number] at [redacted]w[redacted]d who presents to MAU for contractions. She reports contractions started this morning around 0900 and are now every 1-2 minutes. She denies leaking fluid, vaginal bleeding, vaginal itching/odor, or urinary s/s. She reports normal fetal movement. Last intercourse was 2 nights ago.   Pregnancy course: receives [facility]. Pregnancy is low risk complicated by anemia. Next appointment is scheduled on 5/28.  OB History     Gravida  5   Para  2   Term  2   Preterm      AB  2   Living  2      SAB  2   IAB      Ectopic      Multiple  0   Live Births  2           Past Medical History:  Diagnosis Date   Anemia    Headache    Infection    UTI    Past Surgical History:  Procedure Laterality Date   NO PAST SURGERIES     WISDOM TOOTH EXTRACTION      Family History  Problem Relation Age of Onset   Colon cancer Mother    Healthy Father    Asthma Sister    Diabetes Maternal Grandmother    COPD Neg Hx    Hypertension Neg Hx    Stroke Neg Hx     Social History   Tobacco Use   Smoking status: Every Day    Packs/day: 0.25    Years: 1.00    Additional pack years: 0.00    Total pack years: 0.25    Types: Cigarettes   Smokeless tobacco: Never   Tobacco comments:    2-3 cigs per day  Vaping Use   Vaping Use: Never used  Substance Use Topics   Alcohol use: Not Currently    Comment: occasionally, prior to pregnancy   Drug use: Not Currently    Types: Marijuana    Comment: daily, 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   Review of Systems  Gastrointestinal:   Positive for abdominal pain (contractions).  All other systems reviewed and are negative.  Physical Exam   Patient Vitals for the past 24 hrs:  BP Temp Temp src Pulse Resp Height Weight  02/06/23 1446 (!) 117/53 -- -- -- -- -- --  02/06/23 1337 123/62 -- -- (!) 101 -- -- --  02/06/23 1308 118/68 98.4 F (36.9 C) Axillary 100 20 -- --  02/06/23 1259 -- -- -- -- -- 5\' 1"  (1.549 m) 84.4 kg   Physical Exam Vitals and nursing note reviewed. Exam conducted with a chaperone present.  Constitutional:      General: She is not in acute distress. Eyes:     Extraocular Movements: Extraocular movements intact.     Pupils: Pupils are equal, round, and reactive to light.  Cardiovascular:     Rate and Rhythm: Tachycardia present.  Pulmonary:     Effort: Pulmonary effort is normal. No respiratory distress.  Abdominal:     Palpations: Abdomen is soft.     Tenderness: There is no abdominal tenderness.     Comments: Gravid   Musculoskeletal:        General: Normal range of motion.     Cervical back: Normal range of motion.  Skin:    General: Skin is warm and dry.  Neurological:     General: No focal deficit present.     Mental Status: She is alert and oriented to person, place, and time.  Psychiatric:        Mood and Affect: Mood normal.        Behavior: Behavior normal.   Dilation: Fingertip Effacement (%): Thick Cervical Position: Posterior Exam by:: D. Denijah Karrer CNM  NST FHR: 145 bpm, moderate variability, +15x15 accels, no decels Toco: Q4-5 mins  Results for orders placed or performed during the hospital encounter of 02/06/23 (from the past 24 hour(s))  Urinalysis, Routine w reflex microscopic -Urine, Clean Catch     Status: Abnormal   Collection Time: 02/06/23  1:20 PM  Result Value Ref Range   Color, Urine YELLOW YELLOW   APPearance HAZY (A) CLEAR   Specific Gravity, Urine 1.006 1.005 - 1.030   pH 7.0 5.0 - 8.0   Glucose, UA NEGATIVE NEGATIVE mg/dL   Hgb urine dipstick  NEGATIVE NEGATIVE   Bilirubin Urine NEGATIVE NEGATIVE   Ketones, ur 80 (A) NEGATIVE mg/dL   Protein, ur NEGATIVE NEGATIVE mg/dL   Nitrite NEGATIVE NEGATIVE   Leukocytes,Ua MODERATE (A) NEGATIVE   RBC / HPF 0-5 0 - 5 RBC/hpf   WBC, UA 0-5 0 - 5 WBC/hpf   Bacteria, UA RARE (A) NONE SEEN   Squamous Epithelial / HPF 11-20 0 - 5 /HPF  Wet prep, genital     Status: Abnormal   Collection Time: 02/06/23  2:12 PM  Result Value Ref Range   Yeast Wet Prep HPF POC NONE SEEN NONE SEEN   Trich, Wet Prep NONE SEEN NONE SEEN   Clue Cells Wet Prep HPF POC PRESENT (A) NONE SEEN   WBC, Wet Prep HPF POC >=10 (A) <10   Sperm NONE SEEN      MAU Course  Procedures  MDM UA LR bolus Procardia series Wet prep, GC/CT NST  Upon arrival, patient very tearful and uncomfortable., Cervix FT/thick. LR bolus given a long with Procardia series. Wet prep c/w BV. GC/CT pending. UA shows ketonuria. After LR bolus and Procardia, patient reports she feels much better. She declines cervical exam to be repeated. NST reactive and reassuring. Toco quiet.  Assessment and Plan   1. [redacted] weeks gestation of pregnancy   2. Uterine contractions   3. Bacterial vaginosis    - Discharge home in stable condition - Rx for Metrogel - Reviewed adequate hydration - Return precautions given. Return to MAU as needed - Keep OB appointment as scheduled on 5/28   Brand Males, CNM 02/06/2023, 3:22 PM

## 2023-02-06 NOTE — MAU Note (Addendum)
[name] is a 27 y.o. at [redacted]w[redacted]d here in MAU reporting: contractions that started at 0930-1000 and are now 1-2 mins apart. Denies any LOF or VB. Patient tearful upon entering room. Patient vocal and breathing through contractions.Endorses +FM Last sexual intercourse 2 days ago. Denies any urinary symptoms. Lysle Dingwall CNM asked to come to bedside to perform SVE.   0.5/thick/posterior    Onset of complaint: 0930-1000 Pain score: 8/10 Vitals:   02/06/23 1308  BP: 118/68  Pulse: 100  Resp: 20  Temp: 98.4 F (36.9 C)     FHT:145 Lab orders placed from triage:  cervical exam, IV fluids, UA

## 2023-02-07 LAB — GC/CHLAMYDIA PROBE AMP (~~LOC~~) NOT AT ARMC
Chlamydia: NEGATIVE
Comment: NEGATIVE
Comment: NORMAL
Neisseria Gonorrhea: NEGATIVE

## 2023-02-12 ENCOUNTER — Encounter: Payer: Self-pay | Admitting: Family Medicine

## 2023-02-12 ENCOUNTER — Ambulatory Visit (INDEPENDENT_AMBULATORY_CARE_PROVIDER_SITE_OTHER): Payer: Medicaid Other | Admitting: Family Medicine

## 2023-02-12 VITALS — BP 118/68 | HR 104 | Wt 183.0 lb

## 2023-02-12 DIAGNOSIS — Z3A33 33 weeks gestation of pregnancy: Secondary | ICD-10-CM

## 2023-02-12 DIAGNOSIS — O99013 Anemia complicating pregnancy, third trimester: Secondary | ICD-10-CM

## 2023-02-12 DIAGNOSIS — O479 False labor, unspecified: Secondary | ICD-10-CM

## 2023-02-12 DIAGNOSIS — Z3480 Encounter for supervision of other normal pregnancy, unspecified trimester: Secondary | ICD-10-CM

## 2023-02-12 MED ORDER — NIFEDIPINE 10 MG PO CAPS: 10.0000 mg | ORAL_CAPSULE | Freq: Three times a day (TID) | ORAL | 0 refills | Status: DC

## 2023-02-12 NOTE — Progress Notes (Signed)
   PRENATAL VISIT NOTE  Subjective:  Priscilla [name] is a 27 y.o. [phone] at [redacted]w[redacted]d being seen today for ongoing prenatal care.  She is currently monitored for the following issues for this low-risk pregnancy and has STRABISMUS; Allergic rhinitis; Normal delivery; Bacterial vaginitis; Indication for care in labor or delivery; Vacuum-assisted vaginal delivery; Hemorrhagic cyst of ovary; Migraines; Healthcare maintenance; Lipoma of back; Miscarriage; and Supervision of other normal pregnancy, antepartum on their problem list.  Patient reports contractions since this morning, every 4 to 5 minutes, no bleeding, no cramping, and no leaking.  Contractions: Irregular. Vag. Bleeding: None.  Movement: Present. Denies leaking of fluid.   The following portions of the patient's history were reviewed and updated as appropriate: allergies, current medications, past family history, past medical history, past social history, past surgical history and problem list.   Objective:   Vitals:   02/12/23 1440  BP: 118/68  Pulse: (!) 104  Weight: 183 lb (83 kg)    Fetal Status: Fetal Heart Rate (bpm): 146   Movement: Present     General:  Alert, oriented and cooperative. Patient is in no acute distress.  Skin: Skin is warm and dry. No rash noted.   Cardiovascular: Normal heart rate noted  Respiratory: Normal respiratory effort, no problems with respiration noted  Abdomen: Soft, gravid, appropriate for gestational age.  Pain/Pressure: Present     Pelvic: Cervical exam performed in the presence of a chaperone      0.5/thick/high.  Extremities: Normal range of motion.  Edema: Trace  Mental Status: Normal mood and affect. Normal behavior. Normal judgment and thought content.   Assessment and Plan:  Pregnancy: [id number] at [redacted]w[redacted]d 1. Supervision of other normal pregnancy, antepartum Continue routine prenatal care.  Discussed strict MAU precautions.  2. Anemia affecting pregnancy in third trimester Continue p.o.  iron as prescribed  3. Uterine contractions Uterine contractions starting this morning.  Was sexually active last night.  Was going to collect fetal fibronectin but cannot due to sexual activity.  Cervix unchanged from previous exam.  Discussed pelvic rest and will send prescription of Procardia to patient's pharmacy.  Preterm labor symptoms and general obstetric precautions including but not limited to vaginal bleeding, contractions, leaking of fluid and fetal movement were reviewed in detail with the patient. Please refer to After Visit Summary for other counseling recommendations.   No follow-ups on file.  Future Appointments  Date Time Provider Department Center  02/26/2023  2:30 PM Carlynn Herald, CNM [facility] None  03/05/2023  2:30 PM [name], FNP [facility] None  03/12/2023  2:30 PM Anyanwu, Jethro Bastos, MD [facility] None    Celedonio Savage, MD

## 2023-02-26 ENCOUNTER — Ambulatory Visit (INDEPENDENT_AMBULATORY_CARE_PROVIDER_SITE_OTHER): Payer: Medicaid Other | Admitting: Certified Nurse Midwife

## 2023-02-26 ENCOUNTER — Encounter: Payer: Self-pay | Admitting: Certified Nurse Midwife

## 2023-02-26 VITALS — BP 110/67 | HR 104 | Wt 189.6 lb

## 2023-02-26 DIAGNOSIS — Z3009 Encounter for other general counseling and advice on contraception: Secondary | ICD-10-CM

## 2023-02-26 DIAGNOSIS — Z3A35 35 weeks gestation of pregnancy: Secondary | ICD-10-CM

## 2023-02-26 DIAGNOSIS — O479 False labor, unspecified: Secondary | ICD-10-CM

## 2023-02-26 DIAGNOSIS — Z3403 Encounter for supervision of normal first pregnancy, third trimester: Secondary | ICD-10-CM

## 2023-02-26 NOTE — Progress Notes (Signed)
   PRENATAL VISIT NOTE  Subjective:  Priscilla [name] is a 26 y.o. [phone] at [redacted]w[redacted]d being seen today for ongoing prenatal care.  She is currently monitored for the following issues for this low-risk pregnancy and has STRABISMUS; Allergic rhinitis; Normal delivery; Bacterial vaginitis; Indication for care in labor or delivery; Vacuum-assisted vaginal delivery; Hemorrhagic cyst of ovary; Migraines; Healthcare maintenance; Lipoma of back; Miscarriage; and Supervision of other normal pregnancy, antepartum on their problem list.  Patient reports contractions since 1 month. She reports that they only last about an hour and then they spontaneously resolve.  .  Contractions: Irregular. Vag. Bleeding: None.  Movement: Present. Denies leaking of fluid.   The following portions of the patient's history were reviewed and updated as appropriate: allergies, current medications, past family history, past medical history, past social history, past surgical history and problem list.   Objective:   Vitals:   02/26/23 1440  BP: 110/67  Pulse: (!) 104  Weight: 189 lb 9.6 oz (86 kg)    Fetal Status: Fetal Heart Rate (bpm): 136   Movement: Present     General:  Alert, oriented and cooperative. Patient is in no acute distress.  Skin: Skin is warm and dry. No rash noted.   Cardiovascular: Normal heart rate noted  Respiratory: Normal respiratory effort, no problems with respiration noted  Abdomen: Soft, gravid, appropriate for gestational age.  Pain/Pressure: Present     Pelvic: Cervical exam deferred        Extremities: Normal range of motion.  Edema: Trace  Mental Status: Normal mood and affect. Normal behavior. Normal judgment and thought content.   Assessment and Plan:  Pregnancy: [id number] at [redacted]w[redacted]d 1. Encounter for supervision of low-risk first pregnancy in third trimester - Patient doing well.  - She reports vigorous and frequent fetal movement.   2. [redacted] weeks gestation of pregnancy - GBS at next  visit.  - Briefly reviewed what Group Beta Strep is and how it is treated . - Patient verbalized understanding   3. Braxton Hick's contraction - Reviewed signs and symptoms of active labor  - Also dicussed braxton hicks contractions as a normal discomfort of pregnancy.    4. Unwanted Fertility  - Patient desires BTL.  - Plan to place patient on MD schedule for BTL discussion and consents.   Preterm labor symptoms and general obstetric precautions including but not limited to vaginal bleeding, contractions, leaking of fluid and fetal movement were reviewed in detail with the patient. Please refer to After Visit Summary for other counseling recommendations.   Return in about 1 week (around 03/05/2023) for LOB w GBS.  Future Appointments  Date Time Provider Department Center  03/05/2023  2:30 PM Priscilla Lush, FNP [facility] None  03/12/2023  2:30 PM [name], Priscilla Bastos, MD [facility] None    Priscilla [name]) Priscilla Portela, MSN, CNM  Center for [facility]  02/26/2023 3:14 PM

## 2023-02-26 NOTE — Progress Notes (Signed)
Pt presents for ROB visit. Pt c/o contractions and has concerns about IOL

## 2023-03-02 ENCOUNTER — Encounter (HOSPITAL_COMMUNITY): Payer: Self-pay | Admitting: Obstetrics & Gynecology

## 2023-03-02 ENCOUNTER — Other Ambulatory Visit: Payer: Self-pay

## 2023-03-02 ENCOUNTER — Inpatient Hospital Stay (HOSPITAL_COMMUNITY)
Admission: AD | Admit: 2023-03-02 | Discharge: 2023-03-02 | Disposition: A | Discharge status: Home / Self Care | Payer: Medicaid Other | Attending: Obstetrics & Gynecology | Admitting: Obstetrics & Gynecology

## 2023-03-02 DIAGNOSIS — O26893 Other specified pregnancy related conditions, third trimester: Secondary | ICD-10-CM | POA: Diagnosis not present

## 2023-03-02 DIAGNOSIS — Z3689 Encounter for other specified antenatal screening: Secondary | ICD-10-CM | POA: Insufficient documentation

## 2023-03-02 DIAGNOSIS — O4703 False labor before 37 completed weeks of gestation, third trimester: Secondary | ICD-10-CM | POA: Diagnosis not present

## 2023-03-02 DIAGNOSIS — R1032 Left lower quadrant pain: Secondary | ICD-10-CM | POA: Diagnosis not present

## 2023-03-02 DIAGNOSIS — R1031 Right lower quadrant pain: Secondary | ICD-10-CM | POA: Diagnosis not present

## 2023-03-02 DIAGNOSIS — Z3480 Encounter for supervision of other normal pregnancy, unspecified trimester: Secondary | ICD-10-CM

## 2023-03-02 DIAGNOSIS — O479 False labor, unspecified: Secondary | ICD-10-CM

## 2023-03-02 DIAGNOSIS — Z3A35 35 weeks gestation of pregnancy: Secondary | ICD-10-CM | POA: Diagnosis not present

## 2023-03-02 LAB — URINALYSIS, ROUTINE W REFLEX MICROSCOPIC
Bacteria, UA: NONE SEEN
Bilirubin Urine: NEGATIVE
Glucose, UA: NEGATIVE mg/dL
Hgb urine dipstick: NEGATIVE
Ketones, ur: NEGATIVE mg/dL
Nitrite: NEGATIVE
Protein, ur: NEGATIVE mg/dL
Specific Gravity, Urine: 1.001 — ABNORMAL LOW (ref 1.005–1.030)
pH: 7 (ref 5.0–8.0)

## 2023-03-02 MED ORDER — CYCLOBENZAPRINE HCL 5 MG PO TABS
10.0000 mg | ORAL_TABLET | Freq: Once | ORAL | Status: AC
  Administered 2023-03-02: 10 mg via ORAL
  Filled 2023-03-02: qty 2

## 2023-03-02 MED ORDER — ACETAMINOPHEN 500 MG PO TABS
1000.0000 mg | ORAL_TABLET | Freq: Once | ORAL | Status: AC
  Administered 2023-03-02: 1000 mg via ORAL
  Filled 2023-03-02: qty 2

## 2023-03-02 MED ORDER — LACTATED RINGERS IV BOLUS
1000.0000 mL | Freq: Once | INTRAVENOUS | Status: AC
  Administered 2023-03-02: 1000 mL via INTRAVENOUS

## 2023-03-02 NOTE — MAU Provider Note (Signed)
History     CSN: [id number]  Arrival date and time: 03/02/23 1028   Event Date/Time   First Provider Initiated Contact with Patient 03/02/23 1104      Chief Complaint  Patient presents with   Contractions   HPI Priscilla [name] is a 27 y.o. [id number] at [redacted]w[redacted]d who presents to MAU with chief complaint of preterm contractions. Patient reports irregular contractions for about the past month but today her pain increased significantly. She reports bilateral lower abdominal pain with score of 10/10. She denies vaginal bleeding, leaking of fluid, decreased fetal movement, fever, falls, or recent illness.   Patient receives care with [facility].  OB History     Gravida  5   Para  2   Term  2   Preterm      AB  2   Living  2      SAB  2   IAB      Ectopic      Multiple  0   Live Births  2           Past Medical History:  Diagnosis Date   Anemia    Headache    Infection    UTI    Past Surgical History:  Procedure Laterality Date   NO PAST SURGERIES     WISDOM TOOTH EXTRACTION      Family History  Problem Relation Age of Onset   Colon cancer Mother    Healthy Father    Asthma Sister    Diabetes Maternal Grandmother    COPD Neg Hx    Hypertension Neg Hx    Stroke Neg Hx     Social History   Tobacco Use   Smoking status: Every Day    Packs/day: 0.25    Years: 1.00    Additional pack years: 0.00    Total pack years: 0.25    Types: Cigarettes   Smokeless tobacco: Never   Tobacco comments:    2-3 cigs per day  Vaping Use   Vaping Use: Never used  Substance Use Topics   Alcohol use: Not Currently    Comment: occasionally, prior to pregnancy   Drug use: Not Currently    Types: Marijuana    Comment: daily, 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   03/01/2023   Prenatal-DSS-FeCb-FeGl-FA (CITRANATAL BLOOM) 90-1 MG TABS Take 1 tablet by mouth daily. 30 tablet 11 03/01/2023   acetaminophen (TYLENOL) 325 MG tablet Take 650 mg by mouth every 6 (six) hours as needed. (Patient not taking: Reported on 01/01/2023)      Blood Pressure Monitoring (BLOOD PRESSURE KIT) DEVI 1 kit by Does not apply route once a week. (Patient not taking: Reported on 02/12/2023) 1 each 0    Blood Pressure Monitoring (BLOOD PRESSURE KIT) DEVI 1 kit by Does not apply route once a week. (Patient not taking: Reported on 01/01/2023) 1 each 0    Ferric Maltol (ACCRUFER) 30 MG CAPS Take 1 capsule (30 mg total) by mouth 2 (two) times daily before a meal. Take 2 hrs before, or 2 hrs after a meal. (  Patient not taking: Reported on 02/12/2023) 60 capsule 3    loratadine (CLARITIN) 10 MG tablet Take 1 tablet (10 mg total) by mouth daily. (Patient not taking: Reported on 02/12/2023) 30 tablet 11    metroNIDAZOLE (METROGEL) 0.75 % vaginal gel Place 1 Applicatorful vaginally at bedtime. Apply one applicatorful to vagina at bedtime for 5 days (Patient not taking: Reported on 02/12/2023) 70 g 1    Misc. Devices (GOJJI WEIGHT SCALE) MISC 1 Device by Does not apply route every 30 (thirty) days. (Patient not taking: Reported on 01/01/2023) 1 each 0    NIFEdipine (PROCARDIA) 10 MG capsule Take 1 capsule (10 mg total) by mouth 3 (three) times daily. (Patient not taking: Reported on 02/26/2023) 15 capsule 0    ondansetron (ZOFRAN) 4 MG tablet Take 1-2 tablets (4-8 mg total) by mouth every 8 (eight) hours as needed for nausea or vomiting. (Patient not taking: Reported on 12/04/2022) 30 tablet 1    Prenat-Fe Poly-Methfol-FA-DHA (VITAFOL ULTRA) 29-0.6-0.4-200 MG CAPS Take 1 tablet by mouth daily. (Patient not taking: Reported on 12/04/2022) 30 capsule 11     Prenatal Vit-Fe Fumarate-FA (PRENATAL MULTIVITAMIN) TABS tablet Take 1 tablet by mouth daily at 12 noon. (Patient not taking: Reported on 02/12/2023)      promethazine (PHENERGAN) 25 MG tablet Take 1 tablet (25 mg total) by mouth every 6 (six) hours as needed for nausea or vomiting. (Patient not taking: Reported on 01/29/2023) 30 tablet 2    scopolamine (TRANSDERM-SCOP) 1 MG/3DAYS Place 1 patch (1.5 mg total) onto the skin every 3 (three) days. (Patient not taking: Reported on 11/07/2022) 10 patch 12     Review of Systems  Gastrointestinal:  Positive for abdominal pain.  All other systems reviewed and are negative.  Physical Exam   Blood pressure (!) 116/57, pulse (!) 105, temperature 97.6 F (36.4 C), temperature source Oral, resp. rate 19, height 5\' 1"  (1.549 m), weight 85 kg, last menstrual period 06/17/2022, SpO2 100 %, unknown if currently breastfeeding.  Physical Exam Vitals and nursing note reviewed. Exam conducted with a chaperone present.  Constitutional:      General: She is not in acute distress.    Appearance: Normal appearance. She is not ill-appearing.  Cardiovascular:     Rate and Rhythm: Normal rate.     Pulses: Normal pulses.  Pulmonary:     Effort: Pulmonary effort is normal.  Abdominal:     Comments: Gravid  Skin:    Capillary Refill: Capillary refill takes less than 2 seconds.  Neurological:     Mental Status: She is alert and oriented to person, place, and time.  Psychiatric:        Mood and Affect: Mood normal.        Behavior: Behavior normal.        Thought Content: Thought content normal.        Judgment: Judgment normal.     MAU Course  Procedures  MDM  --Reactive tracing: baseline 135, mod var, + accels, no decels --Toco: occasional contractions, predominantly uterine irritability --Cervix closed on arrival, > [redacted] weeks gestation, FFN not indicated. Will attempt to treat pain and recheck cervix in 3 hours or earlier if change in acuity  Orders  Placed This Encounter  Procedures   Urinalysis, Routine w reflex microscopic -Urine, Clean Catch   Discharge patient   Patient Vitals for the past 24 hrs:  BP Temp Temp src Pulse Resp SpO2 Height Weight  03/02/23 1300 129/70 -- -- 87 --  98 % -- --  03/02/23 1250 -- -- -- -- -- 100 % -- --  03/02/23 1245 -- -- -- -- -- 100 % -- --  03/02/23 1240 -- -- -- -- -- 100 % -- --  03/02/23 1235 -- -- -- -- -- 100 % -- --  03/02/23 1230 -- -- -- -- -- 100 % -- --  03/02/23 1225 -- -- -- -- -- 100 % -- --  03/02/23 1220 -- -- -- -- -- 100 % -- --  03/02/23 1215 -- -- -- -- -- 100 % -- --  03/02/23 1210 -- -- -- -- -- 100 % -- --  03/02/23 1205 -- -- -- -- -- 100 % -- --  03/02/23 1200 -- -- -- -- -- 100 % -- --  03/02/23 1155 -- -- -- -- -- 100 % -- --  03/02/23 1150 -- -- -- -- -- 100 % -- --  03/02/23 1146 -- -- -- -- -- 100 % -- --  03/02/23 1145 -- -- -- -- -- 100 % -- --  03/02/23 1140 -- -- -- -- -- 100 % -- --  03/02/23 1135 -- -- -- -- -- 100 % -- --  03/02/23 1130 -- -- -- -- -- 100 % -- --  03/02/23 1115 -- -- -- -- -- 100 % -- --  03/02/23 1110 -- -- -- -- -- 100 % -- --  03/02/23 1105 -- -- -- -- -- 100 % -- --  03/02/23 1101 (!) 116/57 -- -- (!) 105 -- -- -- --  03/02/23 1046 115/64 97.6 F (36.4 C) Oral 98 19 100 % -- --  03/02/23 1043 -- -- -- -- -- -- 5\' 1"  (1.549 m) 85 kg   Results for orders placed or performed during the hospital encounter of 03/02/23 (from the past 24 hour(s))  Urinalysis, Routine w reflex microscopic -Urine, Clean Catch     Status: Abnormal   Collection Time: 03/02/23 10:58 AM  Result Value Ref Range   Color, Urine STRAW (A) YELLOW   APPearance CLEAR CLEAR   Specific Gravity, Urine 1.001 (L) 1.005 - 1.030   pH 7.0 5.0 - 8.0   Glucose, UA NEGATIVE NEGATIVE mg/dL   Hgb urine dipstick NEGATIVE NEGATIVE   Bilirubin Urine NEGATIVE NEGATIVE   Ketones, ur NEGATIVE NEGATIVE mg/dL   Protein, ur NEGATIVE NEGATIVE mg/dL   Nitrite NEGATIVE NEGATIVE    Leukocytes,Ua SMALL (A) NEGATIVE   RBC / HPF 0-5 0 - 5 RBC/hpf   WBC, UA 0-5 0 - 5 WBC/hpf   Bacteria, UA NONE SEEN NONE SEEN   Squamous Epithelial / HPF 0-5 0 - 5 /HPF   Meds ordered this encounter  Medications   lactated ringers bolus 1,000 mL   acetaminophen (TYLENOL) tablet 1,000 mg   cyclobenzaprine (FLEXERIL) tablet 10 mg   Assessment and Plan  --27 y.o. [id number] at [redacted]w[redacted]d  --Braxton hicks contractions --Reactive tracing --UI on toco --Cervix remains closed when rechecked 2 hours later --Pain score improved from 10/10 to 3/10 prior to discharge --Patient verbalizes to CNM she feels ready for discharge home --Discharge home in stable condition with return precautions  Clayton Bibles, MSA, MSN, CNM Certified Nurse Midwife, Biochemist, clinical for [employer], [facility]

## 2023-03-02 NOTE — MAU Note (Signed)
[name] is a 27 y.o. at [redacted]w[redacted]d here in MAU reporting: she's having ctxs 1 minute apart that began 1-2 hours ago.  Denies VB or LOF.  Endorses +FM. LMP: NA Onset of complaint: today Pain score: 10 Vitals:   03/02/23 1046  BP: 115/64  Pulse: 98  Resp: 19  Temp: 97.6 F (36.4 C)  SpO2: 100%     FHT:134 bpm Lab orders placed from triage:   UA

## 2023-03-03 IMAGING — CT CT RENAL STONE PROTOCOL
2 of 4 series · 17 of 46 positions shown, 19 images · non-contrast
Comparison: None.

CLINICAL DATA: Left flank pain, left lower quadrant abdominal pain

EXAM:
CT ABDOMEN AND PELVIS WITHOUT CONTRAST
TECHNIQUE: Multidetector CT imaging of the abdomen and pelvis was performed
following the standard protocol without IV contrast.

[Series 3: renal stone 5.0 · axial · 0.79mm/px · z∈[+776,+1156]mm · 14 of 84 slices shown, 16 images]
[im 4/84  soft-tissue]
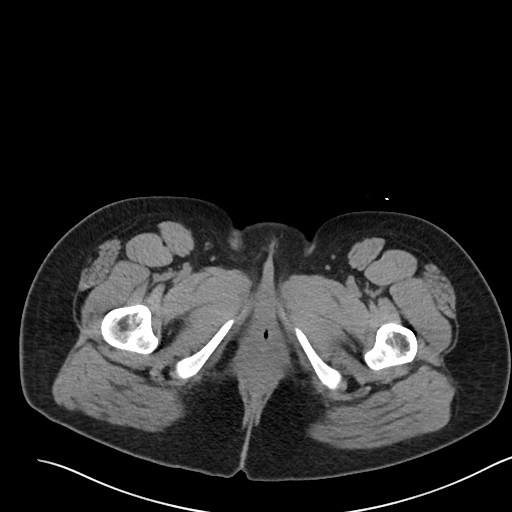
[im 4/84  bone]
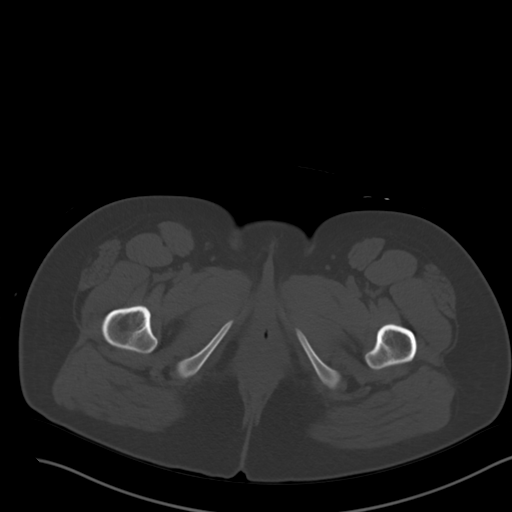
[im 11/84  soft-tissue]
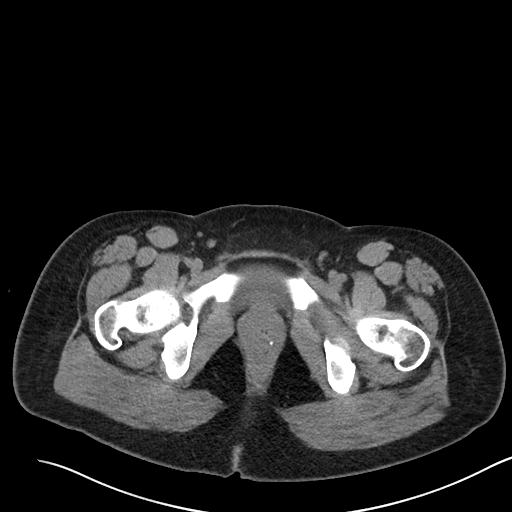
[im 18/84  soft-tissue]
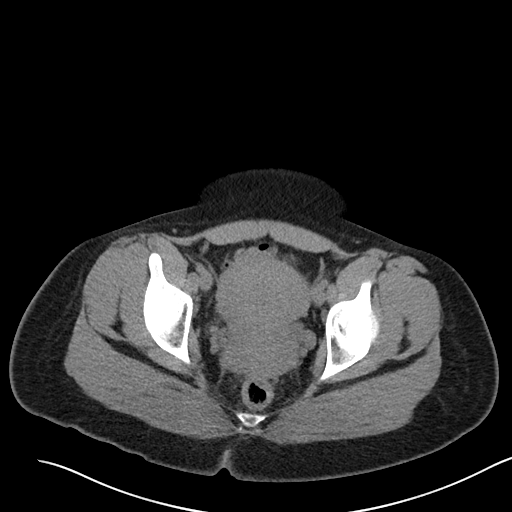
[im 21/84  soft-tissue]
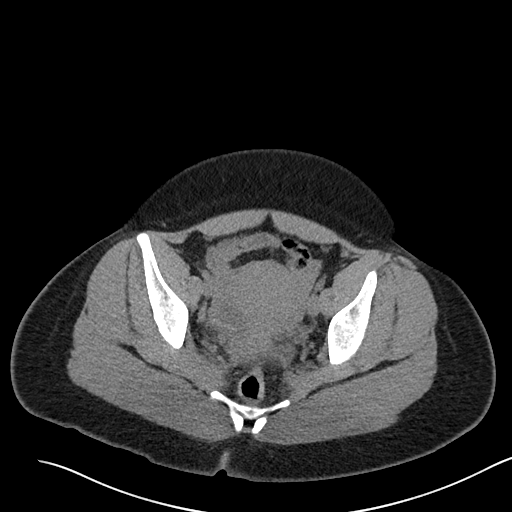
[im 28/84  soft-tissue]
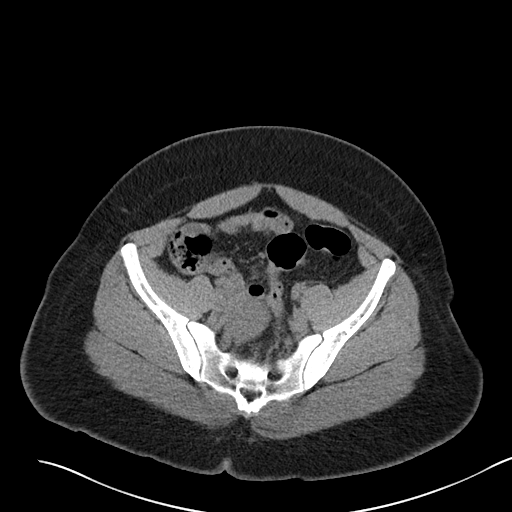
[im 35/84  soft-tissue]
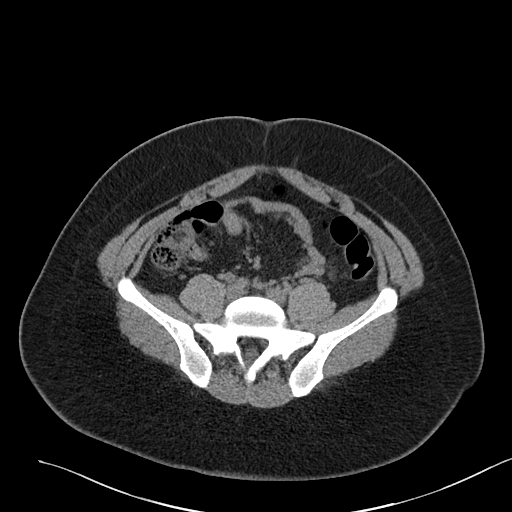
[im 39/84  soft-tissue]
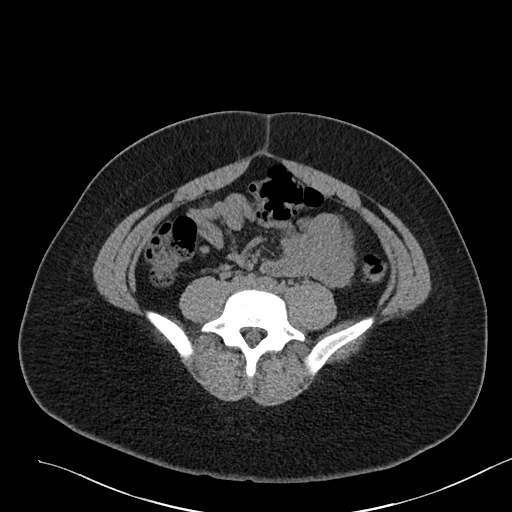
[im 45/84  soft-tissue]
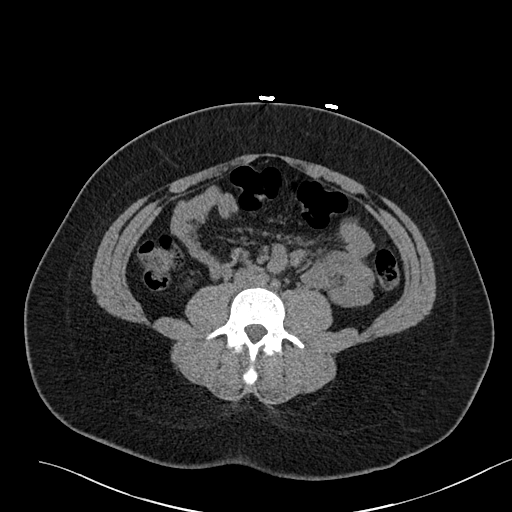
[im 49/84  soft-tissue]
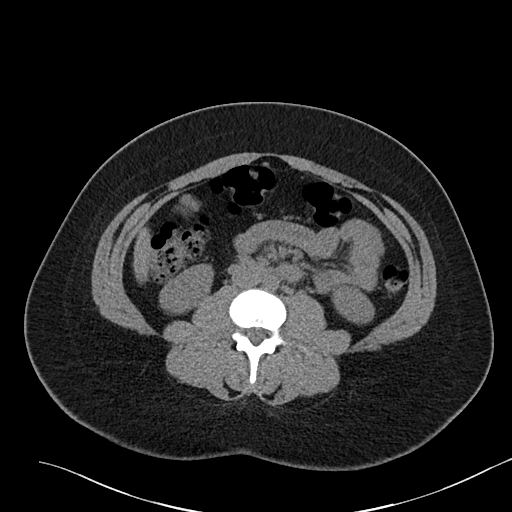
[im 49/84  bone]
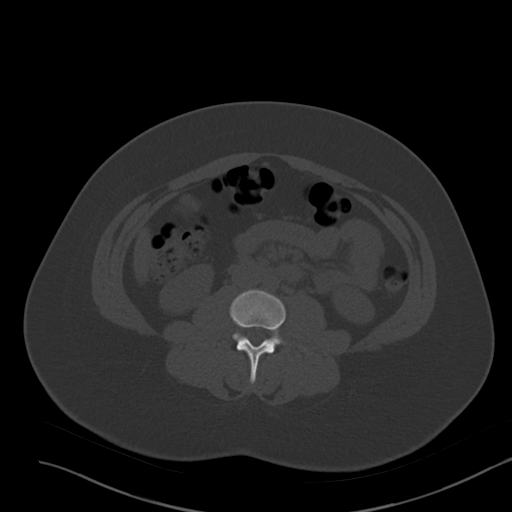
[im 56/84  soft-tissue]
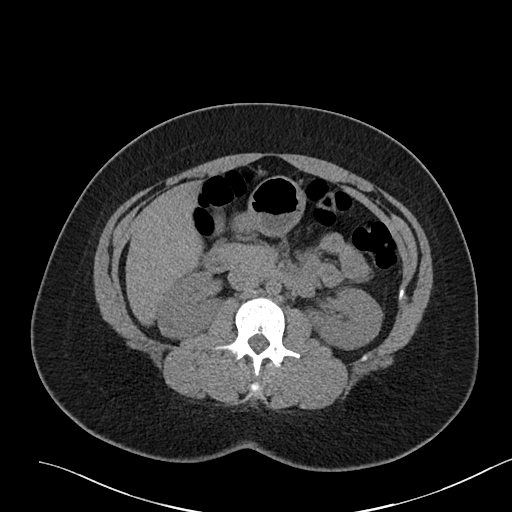
[im 63/84  soft-tissue]
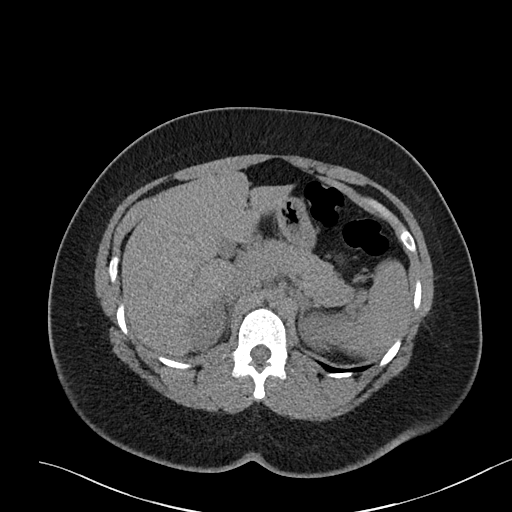
[im 66/84  soft-tissue]
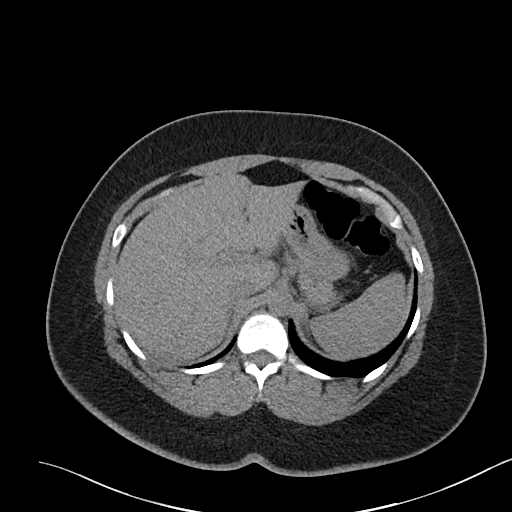
[im 73/84  soft-tissue]
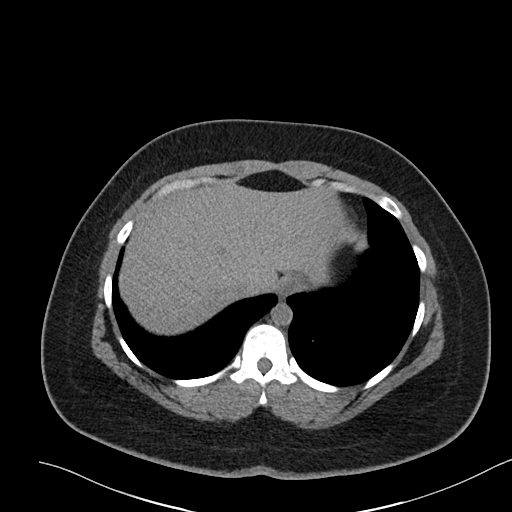
[im 80/84  soft-tissue]
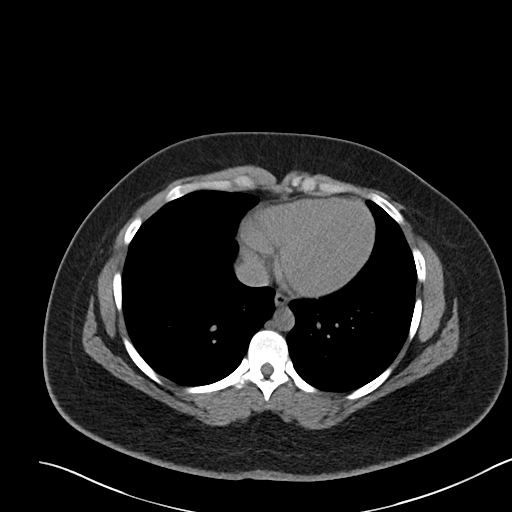

[Series 5: coronal · coronal · 0.79mm/px · 3 of 101 slices shown]
[im 34/101  soft-tissue]
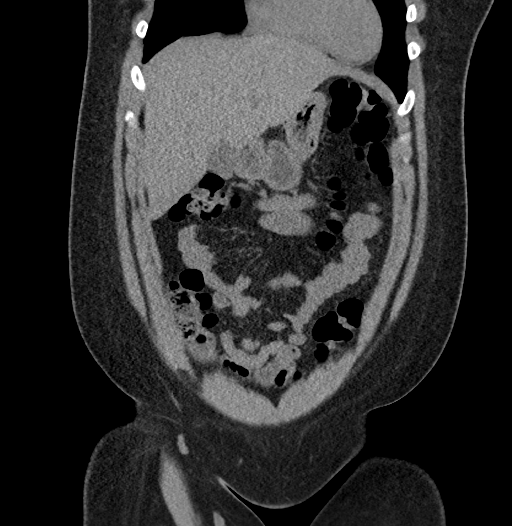
[im 45/101  soft-tissue]
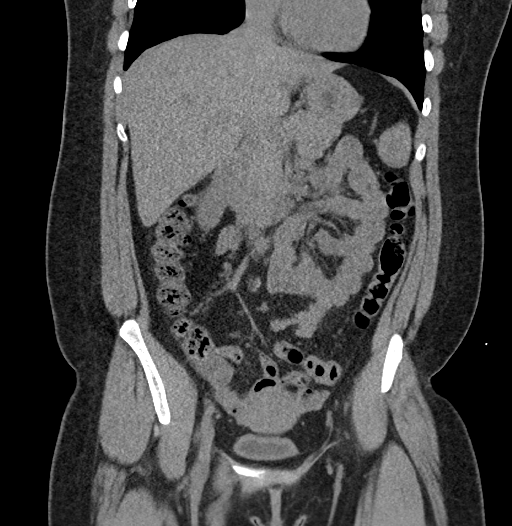
[im 56/101  soft-tissue]
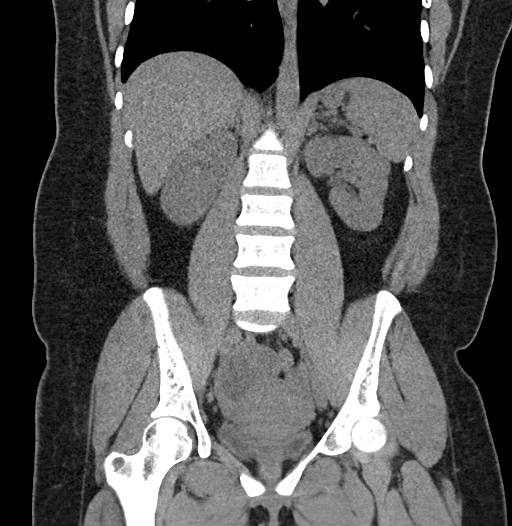

[17 of 46 positions shown; findings below may reference images not displayed]

FINDINGS: Lower chest: No acute abnormality.

Hepatobiliary: No focal liver abnormality is seen. No gallstones,
gallbladder wall thickening, or biliary dilatation.

Pancreas: Unremarkable

Spleen: Unremarkable

Adrenals/Urinary Tract: Adrenal glands are unremarkable. Kidneys are
normal, without renal calculi, focal lesion, or hydronephrosis.
Bladder is unremarkable.

Stomach/Bowel: Stomach is within normal limits. Appendix appears
normal. No evidence of bowel wall thickening, distention, or
inflammatory changes. No free intraperitoneal gas or fluid.

Vascular/Lymphatic: No significant vascular findings are present. No
enlarged abdominal or pelvic lymph nodes.

Reproductive: The right ovary is mildly enlarged and contains a
poorly characterized cystic lesion measuring roughly 3.8 x 3.1 x
cm. The uterus and left ovary are unremarkable.

Other: No abdominal wall hernia identified.

Musculoskeletal: No acute bone abnormality.
IMPRESSION: Indeterminate 5.5 cm right ovarian cystic lesion with enlargement of
the right ovary. Dedicated pelvic sonography with Doppler evaluation
of the ovaries is recommended for further evaluation to better
characterize the cystic lesion and exclude ovarian torsion as a
potential cause for asymmetric right ovarian enlargement.

## 2023-03-05 ENCOUNTER — Telehealth (INDEPENDENT_AMBULATORY_CARE_PROVIDER_SITE_OTHER): Payer: Medicaid Other | Admitting: Obstetrics and Gynecology

## 2023-03-05 ENCOUNTER — Encounter: Payer: Self-pay | Admitting: Obstetrics and Gynecology

## 2023-03-05 VITALS — Wt 187.0 lb

## 2023-03-05 DIAGNOSIS — Z3483 Encounter for supervision of other normal pregnancy, third trimester: Secondary | ICD-10-CM | POA: Diagnosis not present

## 2023-03-05 DIAGNOSIS — Z3A36 36 weeks gestation of pregnancy: Secondary | ICD-10-CM | POA: Diagnosis not present

## 2023-03-05 DIAGNOSIS — Z3480 Encounter for supervision of other normal pregnancy, unspecified trimester: Secondary | ICD-10-CM

## 2023-03-05 NOTE — Progress Notes (Signed)
   OBSTETRICS PRENATAL VIRTUAL VISIT ENCOUNTER NOTE  [facility]: [facility]   Patient location: Home  I connected with Priscilla [name] on 03/05/23 at  2:30 PM EDT by MyChart Video Encounter and verified that I am speaking with the correct person using two identifiers. I discussed the limitations, risks, security and privacy concerns of performing an evaluation and management service virtually and the availability of in person appointments. I also discussed with the patient that there may be a patient responsible charge related to this service. The patient expressed understanding and agreed to proceed. Subjective:  Priscilla [name] is a 27 y.o. [phone] at [redacted]w[redacted]d being seen today for ongoing prenatal care.  She is currently monitored for the following issues for this low-risk pregnancy and has STRABISMUS; Allergic rhinitis; Migraines; Healthcare maintenance; Lipoma of back; and Supervision of other normal pregnancy, antepartum on their problem list.  Patient reports  general discomforts of pregnancy .  Contractions: Irritability. Vag. Bleeding: None.  Movement: Present. Denies any leaking of fluid.   The following portions of the patient's history were reviewed and updated as appropriate: allergies, current medications, past family history, past medical history, past social history, past surgical history and problem list.   Objective:   Vitals:   03/05/23 1352  Weight: 187 lb (84.8 kg)    Fetal Status:     Movement: Present     General:  Alert, oriented and cooperative. Patient is in no acute distress.  Respiratory: Normal respiratory effort, no problems with respiration noted  Mental Status: Normal mood and affect. Normal behavior. Normal judgment and thought content.  Rest of physical exam deferred due to type of encounter  Imaging: No results found.  Assessment and Plan:  Pregnancy: [id number] at [redacted]w[redacted]d 1. Supervision of other normal pregnancy,  antepartum Stable GBS and vaginal swabs next visit. Sign BTL papers   Preterm labor symptoms and general obstetric precautions including but not limited to vaginal bleeding, contractions, leaking of fluid and fetal movement were reviewed in detail with the patient. I discussed the assessment and treatment plan with the patient. The patient was provided an opportunity to ask questions and all were answered. The patient agreed with the plan and demonstrated an understanding of the instructions. The patient was advised to call back or seek an in-person office evaluation/go to MAU at [facility] for any urgent or concerning symptoms. Please refer to After Visit Summary for other counseling recommendations.   I provided 8 minutes of face-to-face time during this encounter.  Return in about 1 week (around 03/12/2023) for OB visit, face to face, MD only.  Future Appointments  Date Time Provider Department Center  03/05/2023  2:30 PM Hermina Staggers, MD [facility] None  03/12/2023  2:30 PM Anyanwu, Jethro Bastos, MD [facility] None    Hermina Staggers, MD Center for [facility], [facility]

## 2023-03-05 NOTE — Progress Notes (Signed)
MyChart OB, Pt needs to come into Office to sign BTL form and get GBS done.

## 2023-03-12 ENCOUNTER — Encounter: Payer: Self-pay | Admitting: Obstetrics & Gynecology

## 2023-03-12 ENCOUNTER — Telehealth (INDEPENDENT_AMBULATORY_CARE_PROVIDER_SITE_OTHER): Payer: Medicaid Other | Admitting: Obstetrics & Gynecology

## 2023-03-12 DIAGNOSIS — Z3A37 37 weeks gestation of pregnancy: Secondary | ICD-10-CM

## 2023-03-12 DIAGNOSIS — Z3483 Encounter for supervision of other normal pregnancy, third trimester: Secondary | ICD-10-CM

## 2023-03-12 DIAGNOSIS — Z3480 Encounter for supervision of other normal pregnancy, unspecified trimester: Secondary | ICD-10-CM

## 2023-03-12 NOTE — Progress Notes (Signed)
OBSTETRICS PRENATAL VIRTUAL VISIT ENCOUNTER NOTE  Provider location: [facility]   Patient location: Home  I connected with Priscilla [place] T Stallman on 03/12/23 at  2:30 PM EDT by MyChart Video Encounter and verified that I am speaking with the correct person using two identifiers. I discussed the limitations, risks, security and privacy concerns of performing an evaluation and management service virtually and the availability of in person appointments. I also discussed with the patient that there may be a patient responsible charge related to this service. The patient expressed understanding and agreed to proceed. Subjective:  Priscilla [name] is a 27 y.o. [phone] at [redacted]w[redacted]d being seen today for ongoing prenatal care.  She is currently monitored for the following issues for this low-risk pregnancy and has Disorder of eye movements; Allergic rhinitis; Migraines; Lipoma of back; and Supervision of other normal pregnancy, antepartum on their problem list.  Patient reports no complaints.  Contractions: Irritability. Vag. Bleeding: None.  Movement: Present. Denies any leaking of fluid.   The following portions of the patient's history were reviewed and updated as appropriate: allergies, current medications, past family history, past medical history, past social history, past surgical history and problem list.   Objective:  There were no vitals filed for this visit.  Fetal Status:     Movement: Present     General:  Alert, oriented and cooperative. Patient is in no acute distress.  Respiratory: Normal respiratory effort, no problems with respiration noted  Mental Status: Normal mood and affect. Normal behavior. Normal judgment and thought content.  Rest of physical exam deferred due to type of encounter  Imaging: No results found.  Assessment and Plan:  Pregnancy: [id number] at [redacted]w[redacted]d 1. [redacted] weeks gestation of pregnancy 2. Supervision of other normal pregnancy, antepartum Had  a long discussion with patient about her desired postpartum sterilization.   Other reversible forms of contraception including the most effective LARCs such as IUD or Nexplanon were discussed with patient; she declines all other modalities.  Details of postpartum tubal sterilization discussed in detail.   She was told that this will be performed as either salpingectomy or occlusion with Filshie clips, depending on difficulty of procedure, exposure and other factors.  Risks of procedure discussed with patient including but not limited to: risk of regret, permanence of method, bleeding, infection, injury to surrounding organs and need for additional procedures.  Failure risk of about 1-2% with increased risk of ectopic gestation if pregnancy occurs was also discussed with patient.   Also discussed possibility of post-tubal syndrome with increased pelvic pain or menstrual irregularities.  Patient is unsure about this, wants to proceed with Nexplanon instead for now. Knows that she always has the option for interval BTL if desired.   She has not signed Medicaid papers yet, was supposed to sign them today, but was unable to come in.  Pelvic cultures to be done next visit.   Term labor symptoms and general obstetric precautions including but not limited to vaginal bleeding, contractions, leaking of fluid and fetal movement were reviewed in detail with the patient. I discussed the assessment and treatment plan with the patient. The patient was provided an opportunity to ask questions and all were answered. The patient agreed with the plan and demonstrated an understanding of the instructions. The patient was advised to call back or seek an in-person office evaluation/go to MAU at [facility] for any urgent or concerning symptoms. Please refer to After Visit Summary for  other counseling recommendations.   I provided 15 minutes of face-to-face time during this encounter.  Return in about 1 week  (around 03/19/2023) for Pelvic cultures, OFFICE OB VISIT (MD or APP).  No future appointments.  Jaynie Collins, MD Center for [employer], [facility]

## 2023-03-12 NOTE — Patient Instructions (Signed)
Return to office for any scheduled appointments. Call the office or go to the MA[facility] at [facility] [place] if: You begin to have strong, frequent contractions Your water breaks.  Sometimes it is a big gush of fluid, sometimes it is just a trickle that keeps getting your underwear wet or running down your legs You have vaginal bleeding.  It is normal to have a small amount of spotting if your cervix was checked.  You do not feel your baby moving like normal.  If you do not, get something to eat and drink and lay down and focus on feeling your baby move.   If your baby is still not moving like normal, you should call the office or go to MAU. Any other obstetric concerns.  

## 2023-03-19 ENCOUNTER — Ambulatory Visit (INDEPENDENT_AMBULATORY_CARE_PROVIDER_SITE_OTHER): Payer: Medicaid Other | Admitting: Obstetrics & Gynecology

## 2023-03-19 ENCOUNTER — Other Ambulatory Visit (HOSPITAL_COMMUNITY)
Admission: RE | Admit: 2023-03-19 | Discharge: 2023-03-19 | Disposition: A | Discharge status: Home / Self Care | Payer: Medicaid Other | Source: Ambulatory Visit | Attending: Obstetrics & Gynecology | Admitting: Obstetrics & Gynecology

## 2023-03-19 VITALS — BP 114/74 | HR 98 | Wt 192.0 lb

## 2023-03-19 DIAGNOSIS — Z3480 Encounter for supervision of other normal pregnancy, unspecified trimester: Secondary | ICD-10-CM | POA: Diagnosis present

## 2023-03-19 NOTE — Progress Notes (Signed)
   PRENATAL VISIT NOTE  Subjective:  Priscilla [name] is a 27 y.o. [phone] at [redacted]w[redacted]d being seen today for ongoing prenatal care.  She is currently monitored for the following issues for this low-risk pregnancy and has Disorder of eye movements; Allergic rhinitis; Migraines; Lipoma of back; and Supervision of other normal pregnancy, antepartum on their problem list.  Patient reports occasional contractions.  Contractions: Irregular. Vag. Bleeding: None.  Movement: Present. Denies leaking of fluid.   The following portions of the patient's history were reviewed and updated as appropriate: allergies, current medications, past family history, past medical history, past social history, past surgical history and problem list.   Objective:   Vitals:   03/19/23 0920  BP: 114/74  Pulse: 98  Weight: 192 lb (87.1 kg)    Fetal Status: Fetal Heart Rate (bpm): 140   Movement: Present  Presentation: Vertex (confirmed by limited US at bedside)  General:  Alert, oriented and cooperative. Patient is in no acute distress.  Skin: Skin is warm and dry. No rash noted.   Cardiovascular: Normal heart rate noted  Respiratory: Normal respiratory effort, no problems with respiration noted  Abdomen: Soft, gravid, appropriate for gestational age.  Pain/Pressure: Present     Pelvic: Cervical exam performed in the presence of a chaperone Dilation: Fingertip Effacement (%): 20 Station: Ballotable  Extremities: Normal range of motion.     Mental Status: Normal mood and affect. Normal behavior. Normal judgment and thought content.   Assessment and Plan:  Pregnancy: [id number] at [redacted]w[redacted]d 1. Supervision of other normal pregnancy, antepartum  - Cervicovaginal ancillary only( [place]) - Strep Gp B Culture+Rflx  Term labor symptoms and general obstetric precautions including but not limited to vaginal bleeding, contractions, leaking of fluid and fetal movement were reviewed in detail with the patient. Please refer to  After Visit Summary for other counseling recommendations.   Return in about 1 week (around 03/26/2023).  Future Appointments  Date Time Provider Department Center  03/25/2023  3:30 PM Leftwich-Kirby, Wilmer Floor, CNM [facility] None    Scheryl Darter, MD

## 2023-03-19 NOTE — Progress Notes (Signed)
Pt complains of irreg ctx, lower pelvic and back pain.  Pt needs cultures today.

## 2023-03-20 ENCOUNTER — Encounter (HOSPITAL_COMMUNITY): Payer: Self-pay | Admitting: Obstetrics and Gynecology

## 2023-03-20 ENCOUNTER — Inpatient Hospital Stay (HOSPITAL_COMMUNITY)
Admission: AD | Admit: 2023-03-20 | Discharge: 2023-03-20 | Disposition: A | Discharge status: Home / Self Care | Payer: Medicaid Other | Attending: Obstetrics and Gynecology | Admitting: Obstetrics and Gynecology

## 2023-03-20 DIAGNOSIS — Z3A38 38 weeks gestation of pregnancy: Secondary | ICD-10-CM | POA: Diagnosis not present

## 2023-03-20 DIAGNOSIS — O471 False labor at or after 37 completed weeks of gestation: Secondary | ICD-10-CM | POA: Diagnosis not present

## 2023-03-20 DIAGNOSIS — O479 False labor, unspecified: Secondary | ICD-10-CM

## 2023-03-20 LAB — CERVICOVAGINAL ANCILLARY ONLY
Chlamydia: NEGATIVE
Comment: NEGATIVE
Comment: NORMAL
Neisseria Gonorrhea: NEGATIVE

## 2023-03-20 NOTE — MAU Note (Signed)
I have communicated with Dr. Salvadore Dom and reviewed vital signs:  Vitals:   03/20/23 1624 03/20/23 1638  BP: (!) 118/58 (!) 111/59  Pulse: (!) 107 (!) 114  Resp: 20   Temp: 98.1 F (36.7 C)   SpO2: 98%     Vaginal exam:  Dilation: Fingertip Effacement (%): 20 Cervical Position: Posterior Station: Ballotable Presentation: Vertex Exam by:: K.Galo Sayed,RN,   Also reviewed contraction pattern and that non-stress test is reactive.  It has been documented that patient is contracting every 3-6 minutes with no cervical change over 1.5 hrs hours not indicating active labor.  Patient denies any other complaints.  Based on this report provider has given order for discharge.  A discharge order and diagnosis entered by a provider.   Labor discharge instructions reviewed with patient.

## 2023-03-20 NOTE — MAU Note (Signed)
[name] is a 27 y.o. at [redacted]w[redacted]d here in MAU reporting: been contracting in lower abd and lower back. Causing her to have anxiety.   No bleeding or LOF, reports +FM.   Was a ft when last checked.  Onset of complaint: 2-3 hrs ago Pain score: 10/8 Vitals:   03/20/23 1624  BP: (!) 118/58  Pulse: (!) 107  Resp: 20  Temp: 98.1 F (36.7 C)  SpO2: 98%     FHT:152 Lab orders placed from triage:

## 2023-03-20 NOTE — Progress Notes (Signed)
S: Priscilla [name] is a 27 y.o. [id number] at [redacted]w[redacted]d  who presents to MAU today complaining contractions She denies vaginal bleeding. She denies LOF. She reports normal fetal movement.    O: BP (!) 111/59   Pulse (!) 114   Temp 98.1 F (36.7 C) (Oral)   Resp 20   Ht 5\' 1"  (1.549 m)   Wt 86.6 kg   LMP 06/17/2022 (Exact Date)   SpO2 98%   BMI 36.09 kg/m   Cervical exam:  Dilation: Fingertip Effacement (%): 20 Cervical Position: Posterior Station: Ballotable Presentation: Vertex Exam by:: K.Wilson,RN   Fetal Monitoring: Baseline: 125bpm Variability: Moderate Accelerations: Present Decelerations: Occasional variable, 1 prolonged decel during maternal moving/positioning Contractions: Irritability   A: SIUP at [redacted]w[redacted]d  False labor Cat I strip, with period of Cat II during maternal movement as reported by RN.   P: -MAU return precautions  Autry-Lott, Randa Evens, DO 03/20/2023 6:25 PM

## 2023-03-23 LAB — STREP GP B CULTURE+RFLX: Strep Gp B Culture+Rflx: NEGATIVE

## 2023-03-24 ENCOUNTER — Inpatient Hospital Stay (HOSPITAL_COMMUNITY)
Admission: AD | Admit: 2023-03-24 | Discharge: 2023-03-27 | DRG: 807 | Disposition: A | Discharge status: Home / Self Care | Payer: Medicaid Other | Attending: Obstetrics and Gynecology | Admitting: Obstetrics and Gynecology

## 2023-03-24 DIAGNOSIS — Z3A39 39 weeks gestation of pregnancy: Secondary | ICD-10-CM

## 2023-03-24 DIAGNOSIS — Z30017 Encounter for initial prescription of implantable subdermal contraceptive: Secondary | ICD-10-CM

## 2023-03-24 DIAGNOSIS — O288 Other abnormal findings on antenatal screening of mother: Secondary | ICD-10-CM | POA: Diagnosis present

## 2023-03-24 DIAGNOSIS — Z3480 Encounter for supervision of other normal pregnancy, unspecified trimester: Principal | ICD-10-CM

## 2023-03-24 DIAGNOSIS — O99334 Smoking (tobacco) complicating childbirth: Secondary | ICD-10-CM | POA: Diagnosis present

## 2023-03-24 DIAGNOSIS — O99214 Obesity complicating childbirth: Secondary | ICD-10-CM | POA: Diagnosis present

## 2023-03-24 DIAGNOSIS — F17210 Nicotine dependence, cigarettes, uncomplicated: Secondary | ICD-10-CM | POA: Diagnosis present

## 2023-03-24 NOTE — MAU Note (Signed)
.  [name] is a 27 y.o. at [redacted]w[redacted]d here in MAU reporting:   Contractions every: 1-2 minutes Onset of ctx: Today 2000 Pain score: 10/10  ROM: Intact Vaginal Bleeding: None Last SVE: .5cm   Epidural: Planning  Fetal Movement: Reports positive FM FHT:145 via External  Vitals:   03/24/23 2320  Pulse: (!) 114  Resp: (!) 22  Temp: 98 F (36.7 C)  SpO2: 100%       OB Office: Faculty GBS: Negative HSV: Denies hx of HSV Lab orders placed from triage: MAU Labor Eval\

## 2023-03-25 ENCOUNTER — Other Ambulatory Visit: Payer: Self-pay

## 2023-03-25 ENCOUNTER — Encounter: Payer: Medicaid Other | Admitting: Advanced Practice Midwife

## 2023-03-25 ENCOUNTER — Encounter (HOSPITAL_COMMUNITY): Payer: Self-pay | Admitting: Obstetrics and Gynecology

## 2023-03-25 ENCOUNTER — Inpatient Hospital Stay (HOSPITAL_COMMUNITY): Payer: Medicaid Other | Admitting: Anesthesiology

## 2023-03-25 DIAGNOSIS — Z30017 Encounter for initial prescription of implantable subdermal contraceptive: Secondary | ICD-10-CM | POA: Diagnosis not present

## 2023-03-25 DIAGNOSIS — O368330 Maternal care for abnormalities of the fetal heart rate or rhythm, third trimester, not applicable or unspecified: Secondary | ICD-10-CM | POA: Diagnosis not present

## 2023-03-25 DIAGNOSIS — F17210 Nicotine dependence, cigarettes, uncomplicated: Secondary | ICD-10-CM | POA: Diagnosis present

## 2023-03-25 DIAGNOSIS — Z3A39 39 weeks gestation of pregnancy: Secondary | ICD-10-CM | POA: Diagnosis not present

## 2023-03-25 DIAGNOSIS — O288 Other abnormal findings on antenatal screening of mother: Secondary | ICD-10-CM | POA: Diagnosis present

## 2023-03-25 DIAGNOSIS — O99214 Obesity complicating childbirth: Secondary | ICD-10-CM | POA: Diagnosis present

## 2023-03-25 DIAGNOSIS — O99334 Smoking (tobacco) complicating childbirth: Secondary | ICD-10-CM | POA: Diagnosis present

## 2023-03-25 LAB — CBC
HCT: 34.7 % — ABNORMAL LOW (ref 36.0–46.0)
Hemoglobin: 11 g/dL — ABNORMAL LOW (ref 12.0–15.0)
MCH: 23.4 pg — ABNORMAL LOW (ref 26.0–34.0)
MCHC: 31.7 g/dL (ref 30.0–36.0)
MCV: 73.7 fL — ABNORMAL LOW (ref 80.0–100.0)
Platelets: 355 10*3/uL (ref 150–400)
RBC: 4.71 MIL/uL (ref 3.87–5.11)
RDW: 14.9 % (ref 11.5–15.5)
WBC: 15 10*3/uL — ABNORMAL HIGH (ref 4.0–10.5)
nRBC: 0 % (ref 0.0–0.2)

## 2023-03-25 LAB — TYPE AND SCREEN
ABO/RH(D): B POS
Antibody Screen: NEGATIVE

## 2023-03-25 LAB — RPR: RPR Ser Ql: NONREACTIVE

## 2023-03-25 MED ORDER — PHENYLEPHRINE 80 MCG/ML (10ML) SYRINGE FOR IV PUSH (FOR BLOOD PRESSURE SUPPORT)
80.0000 ug | PREFILLED_SYRINGE | INTRAVENOUS | Status: AC | PRN
  Administered 2023-03-25 (×3): 80 ug via INTRAVENOUS

## 2023-03-25 MED ORDER — TETANUS-DIPHTH-ACELL PERTUSSIS 5-2.5-18.5 LF-MCG/0.5 IM SUSY: 0.5000 mL | PREFILLED_SYRINGE | Freq: Once | INTRAMUSCULAR | Status: DC

## 2023-03-25 MED ORDER — OXYTOCIN-SODIUM CHLORIDE 30-0.9 UT/500ML-% IV SOLN: 1.0000 m[IU]/min | INTRAVENOUS | Status: DC

## 2023-03-25 MED ORDER — LACTATED RINGERS IV SOLN: INTRAVENOUS | Status: DC

## 2023-03-25 MED ORDER — ACETAMINOPHEN 325 MG PO TABS: 650.0000 mg | ORAL_TABLET | ORAL | Status: DC | PRN

## 2023-03-25 MED ORDER — EPHEDRINE 5 MG/ML INJ
10.0000 mg | INTRAVENOUS | Status: DC | PRN
  Filled 2023-03-25: qty 5

## 2023-03-25 MED ORDER — LACTATED RINGERS IV SOLN: 500.0000 mL | Freq: Once | INTRAVENOUS | Status: DC

## 2023-03-25 MED ORDER — SIMETHICONE 80 MG PO CHEW: 80.0000 mg | CHEWABLE_TABLET | ORAL | Status: DC | PRN

## 2023-03-25 MED ORDER — DIPHENHYDRAMINE HCL 50 MG/ML IJ SOLN: 12.5000 mg | INTRAMUSCULAR | Status: DC | PRN

## 2023-03-25 MED ORDER — LIDOCAINE HCL (PF) 1 % IJ SOLN
INTRAMUSCULAR | Status: DC | PRN
  Administered 2023-03-25: 8 mL via EPIDURAL

## 2023-03-25 MED ORDER — SOD CITRATE-CITRIC ACID 500-334 MG/5ML PO SOLN: 30.0000 mL | ORAL | Status: DC | PRN

## 2023-03-25 MED ORDER — FENTANYL CITRATE (PF) 100 MCG/2ML IJ SOLN
50.0000 ug | INTRAMUSCULAR | Status: DC | PRN
  Administered 2023-03-25: 50 ug via INTRAVENOUS
  Filled 2023-03-25: qty 2

## 2023-03-25 MED ORDER — DIBUCAINE (PERIANAL) 1 % EX OINT: 1.0000 | TOPICAL_OINTMENT | CUTANEOUS | Status: DC | PRN

## 2023-03-25 MED ORDER — WITCH HAZEL-GLYCERIN EX PADS: 1.0000 | MEDICATED_PAD | CUTANEOUS | Status: DC | PRN

## 2023-03-25 MED ORDER — SENNOSIDES-DOCUSATE SODIUM 8.6-50 MG PO TABS
2.0000 | ORAL_TABLET | ORAL | Status: DC
  Administered 2023-03-25 – 2023-03-27 (×3): 2 via ORAL
  Filled 2023-03-25 (×3): qty 2

## 2023-03-25 MED ORDER — IBUPROFEN 600 MG PO TABS
600.0000 mg | ORAL_TABLET | Freq: Four times a day (QID) | ORAL | Status: DC
  Administered 2023-03-25 – 2023-03-27 (×8): 600 mg via ORAL
  Filled 2023-03-25 (×8): qty 1

## 2023-03-25 MED ORDER — FENTANYL-BUPIVACAINE-NACL 0.5-0.125-0.9 MG/250ML-% EP SOLN
12.0000 mL/h | EPIDURAL | Status: DC | PRN
  Administered 2023-03-25: 12 mL/h via EPIDURAL
  Filled 2023-03-25: qty 250

## 2023-03-25 MED ORDER — ZOLPIDEM TARTRATE 5 MG PO TABS: 5.0000 mg | ORAL_TABLET | Freq: Every evening | ORAL | Status: DC | PRN

## 2023-03-25 MED ORDER — EPHEDRINE 5 MG/ML INJ
10.0000 mg | INTRAVENOUS | Status: DC | PRN
  Administered 2023-03-25: 10 mg via INTRAVENOUS

## 2023-03-25 MED ORDER — ONDANSETRON HCL 4 MG/2ML IJ SOLN: 4.0000 mg | INTRAMUSCULAR | Status: DC | PRN

## 2023-03-25 MED ORDER — LACTATED RINGERS IV SOLN
500.0000 mL | INTRAVENOUS | Status: DC | PRN
  Administered 2023-03-25: 500 mL via INTRAVENOUS
  Administered 2023-03-25: 1000 mL via INTRAVENOUS

## 2023-03-25 MED ORDER — ONDANSETRON HCL 4 MG PO TABS: 4.0000 mg | ORAL_TABLET | ORAL | Status: DC | PRN

## 2023-03-25 MED ORDER — MISOPROSTOL 50MCG HALF TABLET
50.0000 ug | ORAL_TABLET | Freq: Once | ORAL | Status: AC
  Administered 2023-03-25: 50 ug via ORAL
  Filled 2023-03-25: qty 1

## 2023-03-25 MED ORDER — LIDOCAINE HCL (PF) 1 % IJ SOLN: 30.0000 mL | INTRAMUSCULAR | Status: DC | PRN

## 2023-03-25 MED ORDER — ONDANSETRON HCL 4 MG/2ML IJ SOLN
4.0000 mg | Freq: Four times a day (QID) | INTRAMUSCULAR | Status: DC | PRN
  Administered 2023-03-25: 4 mg via INTRAVENOUS
  Filled 2023-03-25: qty 2

## 2023-03-25 MED ORDER — COCONUT OIL OIL: 1.0000 | TOPICAL_OIL | Status: DC | PRN

## 2023-03-25 MED ORDER — DIPHENHYDRAMINE HCL 25 MG PO CAPS: 25.0000 mg | ORAL_CAPSULE | Freq: Once | ORAL | Status: DC

## 2023-03-25 MED ORDER — DIPHENHYDRAMINE HCL 25 MG PO CAPS: 25.0000 mg | ORAL_CAPSULE | Freq: Four times a day (QID) | ORAL | Status: DC | PRN

## 2023-03-25 MED ORDER — ACETAMINOPHEN 325 MG PO TABS
650.0000 mg | ORAL_TABLET | ORAL | Status: DC | PRN
  Administered 2023-03-25 – 2023-03-27 (×3): 650 mg via ORAL
  Filled 2023-03-25 (×3): qty 2

## 2023-03-25 MED ORDER — TERBUTALINE SULFATE 1 MG/ML IJ SOLN
0.2500 mg | Freq: Once | INTRAMUSCULAR | Status: AC | PRN
  Administered 2023-03-25: 0.25 mg via SUBCUTANEOUS
  Filled 2023-03-25: qty 1

## 2023-03-25 MED ORDER — DIPHENHYDRAMINE HCL 50 MG/ML IJ SOLN
12.5000 mg | INTRAMUSCULAR | Status: DC | PRN
Start: 2023-03-25 — End: 2023-03-25

## 2023-03-25 MED ORDER — OXYCODONE HCL 5 MG PO TABS
5.0000 mg | ORAL_TABLET | Freq: Four times a day (QID) | ORAL | Status: DC | PRN
  Administered 2023-03-25 – 2023-03-27 (×6): 5 mg via ORAL
  Filled 2023-03-25 (×6): qty 1

## 2023-03-25 MED ORDER — FENTANYL-BUPIVACAINE-NACL 0.5-0.125-0.9 MG/250ML-% EP SOLN
12.0000 mL/h | EPIDURAL | Status: DC | PRN
Start: 2023-03-25 — End: 2023-03-25

## 2023-03-25 MED ORDER — OXYTOCIN BOLUS FROM INFUSION: 333.0000 mL | Freq: Once | INTRAVENOUS | Status: DC

## 2023-03-25 MED ORDER — PRENATAL MULTIVITAMIN CH
1.0000 | ORAL_TABLET | Freq: Every day | ORAL | Status: DC
  Administered 2023-03-25 – 2023-03-27 (×3): 1 via ORAL
  Filled 2023-03-25 (×3): qty 1

## 2023-03-25 MED ORDER — MISOPROSTOL 50MCG HALF TABLET: 50.0000 ug | ORAL_TABLET | ORAL | Status: DC | PRN

## 2023-03-25 MED ORDER — OXYTOCIN-SODIUM CHLORIDE 30-0.9 UT/500ML-% IV SOLN
2.5000 [IU]/h | INTRAVENOUS | Status: DC
  Filled 2023-03-25: qty 500

## 2023-03-25 MED ORDER — BENZOCAINE-MENTHOL 20-0.5 % EX AERO: 1.0000 | INHALATION_SPRAY | CUTANEOUS | Status: DC | PRN

## 2023-03-25 MED ORDER — PHENYLEPHRINE 80 MCG/ML (10ML) SYRINGE FOR IV PUSH (FOR BLOOD PRESSURE SUPPORT)
80.0000 ug | PREFILLED_SYRINGE | INTRAVENOUS | Status: DC | PRN
  Filled 2023-03-25: qty 10

## 2023-03-25 NOTE — H&P (Signed)
[name] is a 27 y.o. female presenting for contractions.  During evaluation fetal heart rate with variable decels f/b a prolonged decel. After discussion, attending agrees that admission is appropriate.   Patient receives care at [facility] and was supervised for a low-risk pregnancy. Pregnancy and medical history significant for problems as listed below. She is GBS negative and expresses a desire for epidural for pain management.  She is anticipating a female infant and requests Nexplanon for PP birth control method.            Nursing Staff Provider  Office Location [facility] Dating  03/24/2023, by Last Menstrual Period  [facility] Model Arly.Keller ] Traditional [ ]  Centering [ ]  Mom-Baby Dyad Anatomy US   11/07/22, F/U in 4 weeks to complete  Language  English      Flu Vaccine  Declined  Genetic/Carrier Screen  NIPS:   Low risks AFP:    Horizon: neg x 4  TDaP Vaccine   Declined 01/29/23 Hgb A1C or  GTT Early  Third trimester   COVID Vaccine Vaccinated   LAB RESULTS   Rhogam  B/Positive/-- (12/21 1221)  Blood Type B/Positive/-- (12/21 1221)   Baby Feeding Plan Bottle Antibody Negative (12/21 1221)  Contraception Nexplanon Rubella 1.09 (12/21 1221)  Circumcision Yes if a boy RPR Non Reactive (04/16 0946)   Pediatrician  [facility] HBsAg Negative (12/21 1221)   Support Person Undecided HCVAb Non Reactive (12/21 1221)   Prenatal Classes   HIV Non Reactive (04/16 0946)     BTL Consent   GBS (For PCN allergy, check sensitivities)   VBAC Consent   Pap       Diagnosis  Date Value Ref Range Status  09/06/2022          - Negative for intraepithelial lesion or malignancy (NILM)             DME Rx Arly.Keller ] BP cuff Arly.Keller ] Weight Scale Waterbirth  [ ]  Class [ ]  Consent [ ]  CNM visit  PHQ9 & GAD7 [ X ] new OB [x ] 28 weeks  [ x] 36 weeks Induction  [ ]  Orders Entered [ ] Foley Y/N      OB History     Gravida  5   Para  2   Term  2   Preterm      AB  2   Living  2       SAB  2   IAB      Ectopic      Multiple  0   Live Births  2          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 Social History:  reports that she has been smoking cigarettes. She has a 0.25 pack-year smoking history. She has never used smokeless tobacco. She reports that she does not currently use alcohol. She reports that she does not currently use drugs after having used the following drugs: Marijuana.     Maternal Diabetes: No Genetic Screening: Normal Maternal Ultrasounds/Referrals: Normal Fetal Ultrasounds or other Referrals:  None Maternal Substance Abuse:  No Significant Maternal Medications:  None Significant Maternal Lab Results:  Group B Strep negative Number of Prenatal Visits:greater than 3 verified prenatal visits Other Comments:  None  Review of Systems  Constitutional:  Negative for chills and fever.  Gastrointestinal:  Positive for abdominal pain (Contractions). Negative for nausea and vomiting.  Genitourinary:  Negative for vaginal bleeding and vaginal discharge.  Neurological:  Positive for headaches (Migraine 10/10).   Maternal Medical History:  Reason for admission: Contractions.  Nausea.  Contractions: Onset was 3-5 hours ago.   Frequency: regular.   Perceived severity is mild.   Fetal activity: Perceived fetal activity is normal.   Prenatal complications: no prenatal complications Prenatal Complications - Diabetes: none.   Dilation: 1 Effacement (%): 20 Station: Ballotable Exam by:: Audie Clear, RN Blood pressure 131/87, pulse (!) 114, temperature 98 F (36.7 C), temperature source Oral, resp. rate (!)  22, height 5\' 1"  (1.549 m), weight 86.4 kg, last menstrual period 06/17/2022, SpO2 100 %, unknown if currently breastfeeding. Maternal Exam:  Uterine Assessment: Contraction strength is mild.  Contraction frequency is regular.  Abdomen: Patient reports no abdominal tenderness. Fetal presentation: vertex Cervix: Cervix evaluated by digital exam.     Fetal Exam Fetal Monitor Review: Baseline rate: 155.  Variability: moderate (6-25 bpm).   Pattern: accelerations present, prolonged decelerations and variable decelerations.   Fetal State Assessment: Category II - tracings are indeterminate.   Physical Exam Vitals reviewed.  Constitutional:      General: She is in acute distress (With contractions).     Appearance: Normal appearance.  Eyes:     Conjunctiva/sclera: Conjunctivae normal.  Cardiovascular:     Rate and Rhythm: Normal rate.  Musculoskeletal:     Cervical back: Normal range of motion.  Skin:    General: Skin is warm and dry.  Neurological:     Mental Status: She is alert and oriented to person, place, and time.     Prenatal labs: ABO, Rh: B/Positive/-- (12/21 1221) Antibody: Negative (12/21 1221) Rubella: 1.09 (12/21 1221) RPR: Non Reactive (04/16 0946)  HBsAg: Negative (12/21 1221)  HIV: Non Reactive (04/16 0946)  GBS: Negative/-- (07/02 0958)   Assessment/Plan: Admit to Birthing Suites  Routine Labor and Delivery Orders per Protocol In room to complete assessment and discuss POC: -Discussed induction methods including cervical ripening agents, foley bulbs, and pitocin. -Patient verbalizes understanding. -Patient requests Tylenol PM and tylenol/benadryl ordered for HA. -Labor team updated.   Cherre Robins 03/25/2023, 12:06 AM

## 2023-03-25 NOTE — Progress Notes (Signed)
Pt admitted to room 402 following a vaginal delivery. Plan of care reviewed, oriented pt and family to room and rules. Pt requested to go outside. Pt advised to stay on the unit and this is a smoke free campus. Pt understanding risks of leaving the unit and verbalized understanding.

## 2023-03-25 NOTE — Progress Notes (Signed)
Labor Progress Note  In to see patient for FHR deceleration after epidural.   Pt is a 27yo 01/2021 at 39/0 a/f augmentation of early labor after prolonged deceleration in MAU.   Prior to epidural FHT 150bpm, moderate variability, +accels, bowl shaped decels but ctx not being well traced so difficult to categorize. After epidural, FHT 180bpm, minimal variability, no accels and frequent variable decelerations then went into a prolonged deceleration. I was present in the room at minute 7. She had just received a dose of terbutaline, a dose of phenylephrine, a bolus and was in hands and knees. Had been examined and by RN, noted to have SROM at that time.  SCE 6/80/-2. FSE placed & FHT were returning to baseline. FSE then dislodged and replaced. PT repositioned to left lateral. FHT returned to 150bpm, moderate variability, no accels, 1 variable decel.   Will CTM closely.   Harvie Bridge, MD Obstetrician & Gynecologist, [facility] for [employer], [facility]

## 2023-03-25 NOTE — Anesthesia Postprocedure Evaluation (Signed)
Anesthesia Post Note  Patient: Priscilla [name]  Procedure(s) Performed: AN AD HOC LABOR EPIDURAL     Patient location during evaluation: Mother Baby Anesthesia Type: Epidural Level of consciousness: awake and alert Pain management: pain level controlled Vital Signs Assessment: post-procedure vital signs reviewed and stable Respiratory status: spontaneous breathing, nonlabored ventilation and respiratory function stable Cardiovascular status: stable Postop Assessment: no headache, no backache and epidural receding Anesthetic complications: no   No notable events documented.  Last Vitals:  Vitals:   03/25/23 0632 03/25/23 0647  BP: 100/64 123/66  Pulse: (!) 105 (!) 103  Resp:    Temp:    SpO2:      Last Pain:  Vitals:   03/24/23 2320  TempSrc: Oral  PainSc:    Pain Goal:                   [name]

## 2023-03-25 NOTE — Anesthesia Preprocedure Evaluation (Signed)
Anesthesia Evaluation  Patient identified by MRN, date of birth, ID band Patient awake    Reviewed: Allergy & Precautions, H&P , NPO status , Patient's Chart, lab work & pertinent test results, reviewed documented beta blocker date and time   Airway Mallampati: II  TM Distance: >3 FB Neck ROM: full    Dental no notable dental hx. (+) Teeth Intact, Dental Advisory Given   Pulmonary neg pulmonary ROS, Current Smoker   Pulmonary exam normal breath sounds clear to auscultation       Cardiovascular negative cardio ROS Normal cardiovascular exam Rhythm:regular Rate:Normal     Neuro/Psych  Headaches  negative psych ROS   GI/Hepatic negative GI ROS, Neg liver ROS,,,  Endo/Other    Morbid obesity  Renal/GU negative Renal ROS  negative genitourinary   Musculoskeletal   Abdominal   Peds  Hematology negative hematology ROS (+) Blood dyscrasia, anemia   Anesthesia Other Findings   Reproductive/Obstetrics (+) Pregnancy                             Anesthesia Physical Anesthesia Plan  ASA: 2  Anesthesia Plan: Epidural   Post-op Pain Management: Minimal or no pain anticipated   Induction: Intravenous  PONV Risk Score and Plan: 2 and Treatment may vary due to age or medical condition  Airway Management Planned: Natural Airway  Additional Equipment:   Intra-op Plan:   Post-operative Plan:   Informed Consent: I have reviewed the patients History and Physical, chart, labs and discussed the procedure including the risks, benefits and alternatives for the proposed anesthesia with the patient or authorized representative who has indicated his/her understanding and acceptance.     Dental Advisory Given  Plan Discussed with: Anesthesiologist  Anesthesia Plan Comments: (Labs checked- platelets confirmed with RN in room. Fetal heart tracing, per RN, reported to be stable enough for sitting  procedure. Discussed epidural, and patient consents to the procedure:  included risk of possible headache,backache, failed block, allergic reaction, and nerve injury. This patient was asked if she had any questions or concerns before the procedure started.)       Anesthesia Quick Evaluation

## 2023-03-25 NOTE — Lactation Note (Signed)
This note was copied from a baby's chart. Lactation Consultation Note  Patient Name: Priscilla [name] Today's Date: 03/25/2023 Age:25 hours  Mom chooses to formula feed.   Maternal Data    Feeding Nipple Type: Slow - flow  LATCH Score                    Lactation Tools Discussed/Used    Interventions    Discharge    Consult Status Consult Status: Complete    Shemica Meath G 03/25/2023, 6:48 PM

## 2023-03-25 NOTE — Consult Note (Signed)
Neonatology Note:   Attendance just after Delivery:    I was asked by Dr. Everhart to attend this baby ~10min after delivery due to dusky appearance, desarations on pulse ox and mildly decreased tone. Term delivery IOL for NRNST, otherwise uncomplicated pregnancy, GBS neg, no fever ROM 1h 53m prior to delivery, fluid clear. On arrival, further desaturations without significant WOB thus BBO2 given.  Baby had just been deep suctioned with moderate amount of clear fluid removed. Fio2 adjusted up to 60% and then over a couple minutes down to 21% given baby's excellent response.  Mild hypotonia, good response to stim and activity. Color pink, lungs CTAB, nl S1S2. VSS on RA.  Baby with passage of a little meconium.  Diaper placed and infant swaddled and given to mother.  Mother updated.  Routine NBN care under supervision of Pediatrician.    Priscilla Sahm C. Mattilyn Crites, MD Neonatologist 03/25/2023, 5:46 AM 

## 2023-03-25 NOTE — Anesthesia Procedure Notes (Signed)
Epidural Patient location during procedure: OB Start time: 03/25/2023 3:03 AM End time: 03/25/2023 3:38 AM  Staffing Anesthesiologist: Bethena Midget, MD  Preanesthetic Checklist Completed: patient identified, IV checked, site marked, risks and benefits discussed, surgical consent, monitors and equipment checked, pre-op evaluation and timeout performed  Epidural Patient position: sitting Prep: DuraPrep and site prepped and draped Patient monitoring: continuous pulse ox and blood pressure Approach: midline Location: L3-L4 Injection technique: LOR air  Needle:  Needle type: Tuohy  Needle gauge: 17 G Needle length: 9 cm and 9 Needle insertion depth: 7 cm Catheter type: closed end flexible Catheter size: 19 Gauge Catheter at skin depth: 13 cm Test dose: negative  Assessment Events: blood not aspirated, no cerebrospinal fluid, injection not painful, no injection resistance, no paresthesia and negative IV test

## 2023-03-26 NOTE — Progress Notes (Addendum)
CSW was informed by family connect that MOB needed a carseat. CSW met with MOB to ask if she still needed a carseat and inform her of $30 cost associated with the support. MOB said yes and reported she was able to obtain the $30 prior to being discharged tomorrow.  CSW informed MOB to inform her nurse once she has the cost and carseat will be delivered shortly after prior to discharge; MOB was understanding.   Enos Fling, Theresia Majors Clinical Social Worker [phone]

## 2023-03-26 NOTE — Progress Notes (Signed)
Post Partum Day 1 Subjective: no complaints, up ad lib, and voiding  Objective: Blood pressure 121/81, pulse 92, temperature 98.2 F (36.8 C), temperature source Oral, resp. rate 18, height 5\' 1"  (1.549 m), weight 86.4 kg, last menstrual period 06/17/2022, SpO2 100 %, unknown if currently breastfeeding.  Physical Exam:  General: alert, cooperative, and no distress Lochia: appropriate Uterine Fundus: firm Incision: n/a DVT Evaluation: No evidence of DVT seen on physical exam. Negative Homan's sign. No cords or calf tenderness. No significant calf/ankle edema.  Recent Labs    03/25/23 0014  HGB 11.0*  HCT 34.7*    Assessment/Plan: Plan for discharge tomorrow   LOS: 1 day   Levie Heritage, DO 03/26/2023, 11:07 AM

## 2023-03-27 ENCOUNTER — Other Ambulatory Visit (HOSPITAL_COMMUNITY): Payer: Self-pay

## 2023-03-27 DIAGNOSIS — Z30017 Encounter for initial prescription of implantable subdermal contraceptive: Secondary | ICD-10-CM

## 2023-03-27 MED ORDER — ACETAMINOPHEN 325 MG PO TABS
650.0000 mg | ORAL_TABLET | ORAL | 0 refills | Status: DC | PRN
  Filled 2023-03-27: qty 100, 9d supply, fill #0

## 2023-03-27 MED ORDER — IBUPROFEN 600 MG PO TABS
600.0000 mg | ORAL_TABLET | Freq: Four times a day (QID) | ORAL | 0 refills | Status: DC
  Filled 2023-03-27: qty 30, 8d supply, fill #0

## 2023-03-27 MED ORDER — LIDOCAINE HCL 1 % IJ SOLN
0.0000 mL | Freq: Once | INTRAMUSCULAR | Status: AC | PRN
  Administered 2023-03-27: 20 mL via INTRADERMAL
  Filled 2023-03-27: qty 20

## 2023-03-27 MED ORDER — SENNOSIDES-DOCUSATE SODIUM 8.6-50 MG PO TABS
2.0000 | ORAL_TABLET | ORAL | 0 refills | Status: DC
  Filled 2023-03-27: qty 30, 15d supply, fill #0

## 2023-03-27 MED ORDER — ETONOGESTREL 68 MG ~~LOC~~ IMPL
68.0000 mg | DRUG_IMPLANT | Freq: Once | SUBCUTANEOUS | Status: AC
  Administered 2023-03-27: 68 mg via SUBCUTANEOUS
  Filled 2023-03-27: qty 1

## 2023-03-27 NOTE — Discharge Instructions (Signed)
WHAT TO LOOK OUT FOR: Fever of 100.4 or above Mastitis: feels like flu and breasts hurt Infection: increased pain, swelling or redness Blood clots golf ball size or larger Postpartum depression   Congratulations on your newest addition! 

## 2023-03-27 NOTE — Progress Notes (Addendum)
POSTPARTUM PROGRESS NOTE  Subjective: Priscilla [name] is a 27 y.o. [id number] s/p SVD at [redacted]w[redacted]d.  She reports she doing well. No acute events overnight. She denies any problems with ambulating, voiding or po intake. Denies nausea or vomiting. She has passed flatus. Pain is well controlled.  Vaginal bleeding is minimal. Pt does note some pain around her epidural injection site, states that this resolves with tylenol and ibuprofen.  Objective: Blood pressure 116/66, pulse 81, temperature 98.3 F (36.8 C), temperature source Oral, resp. rate 18, height 5\' 1"  (1.549 m), weight 86.4 kg, last menstrual period 06/17/2022, SpO2 100 %, unknown if currently breastfeeding.  Physical Exam:  General: alert, cooperative and no distress Chest: no respiratory distress Abdomen: soft, non-tender  Uterine Fundus: firm and at level of umbilicus Extremities: No calf swelling or tenderness  no LE edema Skin: No redness, warmth, swelling, or drainage to epidural placement site.   Recent Labs    03/25/23 0014  HGB 11.0*  HCT 34.7*    Assessment/Plan: Priscilla [place] T Repetto is a 27 y.o. [phone] s/p SVD at [redacted]w[redacted]d for IOL for NR NST.  Routine Postpartum Care: Doing well, pain well-controlled.  -- Continue routine care, lactation support  -- Contraception: nexplanon today -- Feeding: breast  Dispo: Plan for discharge today.  Para March, DO Faculty Practice, [facility] for [employer] 03/27/2023 8:54 AM    I confirm that I have verified and agree with the information documented in the resident's note.   Nexplanon placed today without difficulty. Please see procedure note.  Message sent to office regarding patient Postpartum Appointment.    Carlynn Herald, CNM 03/27/2023 2:20 PM

## 2023-03-27 NOTE — Progress Notes (Addendum)
CSW has obtained the cost and delivered the carseat to MOB at bedside.  Railee Bonillas, LCSWA Clinical Social Worker [phone]  

## 2023-03-27 NOTE — Procedures (Signed)
GYNECOLOGY PROCEDURE NOTE  [place] T Buckel is a 27 y.o. [phone] requesting Nexplanon insertion. No gynecologic concerns.  Nexplanon Insertion Procedure Patient identified, informed consent performed, consent signed. Patient does understand that irregular bleeding is a very common side effect of this medication. She was advised to have backup contraception for one week after placement. Appropriate time out taken. Patient's left arm was prepped and draped in the usual sterile fashion. The insertion area was measured and marked. Patient was prepped with alcohol swab and then injected with 3 ml of 1% lidocaine. The area was then prepped with betadine. Nexplanon removed from packaging and device confirmed present within needle, then inserted full length of needle and withdrawn per handbook instructions. Nexplanon was able to palpated in the patient's arm; patient palpated the insert herself. There was minimal blood loss. Patient insertion site covered with steri strip, guaze, and a pressure bandage to reduce any bruising. The patient tolerated the procedure well and was given post procedure instructions.   Lot# [id number]  Exp: 12/2024   Elnoria Howard) [name], MSN, CNM  Center for [facility]  03/27/2023 2:19 PM

## 2023-03-27 NOTE — Discharge Summary (Addendum)
Postpartum Discharge Summary  Date of Service updated     Patient Name: Priscilla [name] DOB: [birth year] MRN: [id number]  Date of admission: 03/24/2023 Delivery date:03/25/2023  Delivering provider: Celedonio Savage  Date of discharge: 03/27/2023  Admitting diagnosis: Non-reactive NST (non-stress test) [O28.8] Intrauterine pregnancy: [redacted]w[redacted]d     Secondary diagnosis:  Principal Problem:   Non-reactive NST (non-stress test)  Additional problems: Prior to epidural FHT 150bpm, moderate variability, +accels, bowl shaped decels but ctx not being well traced so difficult to categorize. After epidural, FHT 180bpm, minimal variability, no accels and frequent variable decelerations then went into a prolonged deceleration.  FSE placed & FHT were returning to baseline. FSE then dislodged and replaced. PT repositioned to left lateral. FHT returned to 150bpm, moderate variability, no accels, 1 variable decel.     Discharge diagnosis: Term Pregnancy Delivered                                              Post partum procedures: none Augmentation: Cytotec Complications: None  Hospital course: Induction of Labor With Vaginal Delivery   27 y.o. yo [phone] at [redacted]w[redacted]d was admitted to the hospital 03/24/2023 for induction of labor.  Indication for induction:  variable decels .  Patient had an labor course complicated bynone Membrane Rupture Time/Date: 3:22 AM ,03/25/2023   Delivery Method:Vaginal, Spontaneous  Episiotomy: None  Lacerations:  None  Details of delivery can be found in separate delivery note.  Patient had a postpartum course complicated by none. Patient is discharged home 03/27/23.  Newborn Data: Birth date:03/25/2023  Birth time:5:15 AM  Gender:Female  Living status:Living  Apgars:8 ,9  Weight:2807 g   Magnesium Sulfate received: No BMZ received: No Rhophylac:No MMR:No T-DaP:Given postpartum Flu: No Transfusion:No  Physical exam  Vitals:   03/26/23 0912 03/26/23 1513 03/26/23 2100  03/27/23 0446  BP: 121/81 123/86 134/72 116/66  Pulse: 92 81 73 81  Resp: 18 18 18 18   Temp: 98.2 F (36.8 C) 97.8 F (36.6 C) 98.3 F (36.8 C) 98.3 F (36.8 C)  TempSrc: Oral Oral Oral Oral  SpO2: 100% 100% 98% 100%  Weight:      Height:       General: alert, cooperative, and no distress Lochia: appropriate Uterine Fundus: firm Incision: N/A DVT Evaluation: No evidence of DVT seen on physical exam. No cords or calf tenderness. No significant calf/ankle edema. Labs: Lab Results  Component Value Date   WBC 15.0 (H) 03/25/2023   HGB 11.0 (L) 03/25/2023   HCT 34.7 (L) 03/25/2023   MCV 73.7 (L) 03/25/2023   PLT 355 03/25/2023      Latest Ref Rng & Units 09/24/2022   12:39 AM  CMP  Glucose 70 - 99 mg/dL 638   BUN 6 - 20 mg/dL <5   Creatinine 7.56 - 1.00 mg/dL 4.33   Sodium 295 - 188 mmol/L 133   Potassium 3.5 - 5.1 mmol/L 3.3   Chloride 98 - 111 mmol/L 103   CO2 22 - 32 mmol/L 20   Calcium 8.9 - 10.3 mg/dL 8.9   Total Protein 6.5 - 8.1 g/dL 7.3   Total Bilirubin 0.3 - 1.2 mg/dL 0.7   Alkaline Phos 38 - 126 U/L 47   AST 15 - 41 U/L 18   ALT 0 - 44 U/L 12    Edinburgh Score:  03/25/2023    9:09 AM  Edinburgh Postnatal Depression Scale Screening Tool  I have been able to laugh and see the funny side of things. 0  I have looked forward with enjoyment to things. 0  I have blamed myself unnecessarily when things went wrong. 0  I have been anxious or worried for no good reason. 3  I have felt scared or panicky for no good reason. 0  Things have been getting on top of me. 0  I have been so unhappy that I have had difficulty sleeping. 0  I have felt sad or miserable. 0  I have been so unhappy that I have been crying. 0  The thought of harming myself has occurred to me. 0  Edinburgh Postnatal Depression Scale Total 3     After visit meds:  Allergies as of 03/27/2023       Reactions   Chocolate Other (See Comments)   Migraines   Amoxil [amoxicillin] Other (See  Comments)   Unknown reaction        Medication List     STOP taking these medications    diphenhydramine-acetaminophen 25-500 MG Tabs tablet Commonly known as: TYLENOL PM       TAKE these medications    acetaminophen 325 MG tablet Commonly known as: Tylenol Take 2 tablets (650 mg total) by mouth every 4 (four) hours as needed (for pain scale < 4).   CitraNatal Bloom 90-1 MG Tabs Take 1 tablet by mouth daily.   ibuprofen 600 MG tablet Commonly known as: ADVIL Take 1 tablet (600 mg total) by mouth every 6 (six) hours.   senna-docusate 8.6-50 MG tablet Commonly known as: Senokot-S Take 2 tablets by mouth daily.         Discharge home in stable condition Infant Feeding: Bottle and Breast Infant Disposition:home with mother Discharge instruction: per After Visit Summary and Postpartum booklet. Activity: Advance as tolerated. Pelvic rest for 6 weeks.  Diet: routine diet Future Appointments:No future appointments. Follow up Visit:   Please schedule this patient for a In person postpartum visit in 4 weeks with the following provider: Any provider. Additional Postpartum F/U:Postpartum Depression checkup  Low risk pregnancy complicated by:  none Delivery mode:  Vaginal, Spontaneous  Anticipated Birth Control:  PP Nexplanon placed   03/27/2023 Kirstie Everhart, DO   I confirm that I have verified and agree with the information documented in the resident's note.   Fundus firm, bleeding minimal. Patient up ad lib. Please see provider note for Nexplanon placement.   Carlynn Herald, CNM 03/28/2023 2:18 PM

## 2023-04-03 ENCOUNTER — Encounter: Payer: Medicaid Other | Admitting: Obstetrics and Gynecology

## 2023-04-20 ENCOUNTER — Telehealth (HOSPITAL_COMMUNITY): Payer: Self-pay | Admitting: *Deleted

## 2023-04-20 NOTE — Telephone Encounter (Signed)
04/20/2023  Name: Priscilla [name] MRN: [id number] DOB: 02/28/1996  Reason for Call:  Transition of Care Hospital Discharge Call  Contact Status: Patient Contact Status: Complete  Language assistant needed:          Follow-Up Questions: Do You Have Any Concerns About Your Health As You Heal From Delivery?: No Do You Have Any Concerns About Your Infants Health?: No  Edinburgh Postnatal Depression Scale:  In the Past 7 Days:   EPDS not done at this time. Patient stated, "I did that at the doctor's office." Unable to recall score, but stated, "I'm doing OK." PHQ2-9 Depression Scale:     Discharge Follow-up: Edinburgh score requires follow up?: N/A Patient was advised of the following resources:: Breastfeeding Support Group, Support Group  Post-discharge interventions: Reviewed Newborn Safe Sleep Practices  Signature Deforest Hoyles, RN, [phone]

## 2023-04-25 ENCOUNTER — Telehealth (INDEPENDENT_AMBULATORY_CARE_PROVIDER_SITE_OTHER): Payer: Medicaid Other | Admitting: Family Medicine

## 2023-04-25 ENCOUNTER — Encounter: Payer: Self-pay | Admitting: Family Medicine

## 2023-04-25 DIAGNOSIS — F17200 Nicotine dependence, unspecified, uncomplicated: Secondary | ICD-10-CM | POA: Diagnosis not present

## 2023-04-25 MED ORDER — NICOTINE 14 MG/24HR TD PT24
14.0000 mg | MEDICATED_PATCH | Freq: Every day | TRANSDERMAL | 2 refills | Status: DC
Start: 2023-04-25 — End: 2024-04-28

## 2023-04-25 MED ORDER — NICOTINE POLACRILEX 2 MG MT GUM
2.0000 mg | CHEWING_GUM | OROMUCOSAL | 3 refills | Status: DC
Start: 2023-04-25 — End: 2024-04-28

## 2023-04-25 NOTE — Progress Notes (Signed)
..  Provider location: Center for [facility]   Patient location: Home  I connected with Priscilla Rico on 04/25/23 at  3:50 PM EDT by Mychart Video Encounter and verified that I am speaking with the correct person using two identifiers.       I discussed the limitations, risks, security and privacy concerns of performing an evaluation and management service virtually and the availability of in person appointments. I also discussed with the patient that there may be a patient responsible charge related to this service. The patient expressed understanding and agreed to proceed.  Post Partum Visit Note Subjective:   Priscilla [name] is a 27 y.o. [phone] female who presents for a postpartum visit. She is 4 weeks postpartum following a normal spontaneous vaginal delivery.  I have fully reviewed the prenatal and intrapartum course. The delivery was at 39.0 gestational weeks.  Anesthesia: epidural. Postpartum course has been good. Baby is doing well. Baby is feeding by bottle - Similac Sensitive RS. Bleeding staining only. Bowel function is normal. Bladder function is normal. Patient is sexually active. Contraception method is Nexplanon. Postpartum depression screening: negative.  The pregnancy intention screening data noted above was reviewed. Potential methods of contraception were discussed. The patient elected to proceed with No data recorded.  The following portions of the patient's history were reviewed and updated as appropriate: allergies, current medications, past family history, past medical history, past social history, past surgical history, and problem list.  Review of Systems Pertinent items are noted in HPI.  Objective:  LMP 06/17/2022 (Exact Date)   Breastfeeding No     General:  Alert, oriented and cooperative. Patient is in no acute distress.  Respiratory: Normal respiratory effort, no problems with respiration noted  Mental Status: Normal mood and affect. Normal  behavior. Normal judgment and thought content.  Rest of physical exam deferred due to type of encounter   Assessment:    Normal postpartum exam.  Plan:  Essential components of care per ACOG recommendations:  1.  Mood and well being: Patient with negative depression screening today. Reviewed local resources for support.  - Patient does use tobacco. Discussed smoking cessation. She is currently smoking 7-8 sticks a day, but making efforts to quite. Rx for nicotine patch and gum sent. Discussed how to use.  - hx of drug use? No    2. Infant care and feeding:  -Patient currently breastmilk feeding? No . Currently bottle feeding with similac. -Social determinants of health (SDOH) reviewed in EPIC. The following needs were identified tobacco smoking. Discussed above.  3. Sexuality, contraception and birth spacing - Patient does not want a pregnancy in the next year.  Desired family size is 3 children.  - Reviewed forms of contraception in tiered fashion. Patient has nexplanon which was placed postpartum. - Discussed birth spacing of 18 months  4. Sleep and fatigue -Encouraged family/partner/community support of 4 hrs of uninterrupted sleep to help with mood and fatigue  5. Physical Recovery  - Discussed patients delivery and complications - Patient had no laceration, perineal healing reviewed. Patient expressed understanding - Patient has urinary incontinence? No - Patient is safe to resume physical and sexual activity  6.  Health Maintenance - Last pap smear done 08/2022 and was normal with no HPV testing done. Next pap smear due in 08/2025. Patient informed.  7. Chronic Disease - none - PCP follow up  I provided 18 minutes of face-to-face time during this encounter.    Sheppard Evens MD MPH  OB Fellow, Faculty Practice [facility], [facility] for [facility] 04/25/2023

## 2023-08-21 ENCOUNTER — Encounter: Payer: Medicaid Other | Admitting: Student

## 2023-09-06 ENCOUNTER — Telehealth: Payer: Medicaid Other | Admitting: Family Medicine

## 2023-09-06 DIAGNOSIS — K047 Periapical abscess without sinus: Secondary | ICD-10-CM | POA: Diagnosis not present

## 2023-09-06 MED ORDER — CLINDAMYCIN HCL 300 MG PO CAPS: 300.0000 mg | ORAL_CAPSULE | Freq: Three times a day (TID) | ORAL | 0 refills | Status: AC

## 2023-09-06 NOTE — Progress Notes (Signed)
Virtual Visit Consent   Priscilla [name], you are scheduled for a virtual visit with a [facility] provider today. Just as with appointments in the office, your consent must be obtained to participate. Your consent will be active for this visit and any virtual visit you may have with one of our providers in the next 365 days. If you have a MyChart account, a copy of this consent can be sent to you electronically.  As this is a virtual visit, video technology does not allow for your provider to perform a traditional examination. This may limit your provider's ability to fully assess your condition. If your provider identifies any concerns that need to be evaluated in person or the need to arrange testing (such as labs, EKG, etc.), we will make arrangements to do so. Although advances in technology are sophisticated, we cannot ensure that it will always work on either your end or our end. If the connection with a video visit is poor, the visit may have to be switched to a telephone visit. With either a video or telephone visit, we are not always able to ensure that we have a secure connection.  By engaging in this virtual visit, you consent to the provision of healthcare and authorize for your insurance to be billed (if applicable) for the services provided during this visit. Depending on your insurance coverage, you may receive a charge related to this service.  I need to obtain your verbal consent now. Are you willing to proceed with your visit today? Priscilla [name] has provided verbal consent on 09/06/2023 for a virtual visit (video or telephone). Georgana Curio, FNP  Date: 09/06/2023 7:01 PM  Virtual Visit via Video Note   I, Georgana Curio, connected with  Priscilla [name]  ([id number], October 10, 1995) on 09/06/23 at  7:00 PM EST by a video-enabled telemedicine application and verified that I am speaking with the correct person using two identifiers.  Location: Patient: Virtual Visit Location Patient:  Home Provider: Virtual Visit Location Provider: Home Office   I discussed the limitations of evaluation and management by telemedicine and the availability of in person appointments. The patient expressed understanding and agreed to proceed.    History of Present Illness: Priscilla [name] is a 27 y.o. who identifies as a female who was assigned female at birth, and is being seen today for left upper tooth cracked and painful for a week persisting. No fever. 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 Keep on for 12hrs, remove at bedtime, Disp: 28 patch, Rfl: 2   nicotine polacrilex (NICORETTE) 2 MG gum, Take 1 each (2 mg total) by mouth every 2 (two) hours while awake., Disp: 40 each, Rfl: 3  Observations/Objective: Patient is well-developed, well-nourished in no acute distress.  Resting comfortably  at home.  Head is normocephalic, atraumatic.  No labored breathing.  Speech  is clear and coherent with logical content.  Patient is alert and oriented at baseline.    Assessment and Plan: 1. Dental abscess (Primary)   Warm salt water gargles, ibuprofen as directed, call for apptmt with dentist.   Follow Up Instructions: I discussed the assessment and treatment plan with the patient. The patient was provided an opportunity to ask questions and all were answered. The patient agreed with the plan and  demonstrated an understanding of the instructions.  A copy of instructions were sent to the patient via MyChart unless otherwise noted below.     The patient was advised to call back or seek an in-person evaluation if the symptoms worsen or if the condition fails to improve as anticipated.    Georgana Curio, FNP

## 2023-09-09 ENCOUNTER — Encounter: Payer: Self-pay | Admitting: Obstetrics and Gynecology

## 2023-09-09 ENCOUNTER — Telehealth: Payer: Medicaid Other | Admitting: Nurse Practitioner

## 2023-09-09 ENCOUNTER — Encounter: Payer: Self-pay | Admitting: Certified Nurse Midwife

## 2023-09-09 DIAGNOSIS — K0889 Other specified disorders of teeth and supporting structures: Secondary | ICD-10-CM

## 2023-09-09 MED ORDER — LIDOCAINE VISCOUS HCL 2 % MT SOLN
OROMUCOSAL | 0 refills | Status: DC
Start: 2023-09-09 — End: 2024-04-28

## 2023-09-09 NOTE — Progress Notes (Signed)
Virtual Visit Consent   Priscilla [name], you are scheduled for a virtual visit with a [facility] provider today. Just as with appointments in the office, your consent must be obtained to participate. Your consent will be active for this visit and any virtual visit you may have with one of our providers in the next 365 days. If you have a MyChart account, a copy of this consent can be sent to you electronically.  As this is a virtual visit, video technology does not allow for your provider to perform a traditional examination. This may limit your provider's ability to fully assess your condition. If your provider identifies any concerns that need to be evaluated in person or the need to arrange testing (such as labs, EKG, etc.), we will make arrangements to do so. Although advances in technology are sophisticated, we cannot ensure that it will always work on either your end or our end. If the connection with a video visit is poor, the visit may have to be switched to a telephone visit. With either a video or telephone visit, we are not always able to ensure that we have a secure connection.  By engaging in this virtual visit, you consent to the provision of healthcare and authorize for your insurance to be billed (if applicable) for the services provided during this visit. Depending on your insurance coverage, you may receive a charge related to this service.  I need to obtain your verbal consent now. Are you willing to proceed with your visit today? Priscilla [name] has provided verbal consent on 09/09/2023 for a virtual visit (video or telephone). Viviano Simas, FNP  Date: 09/09/2023 5:47 PM  Virtual Visit via Video Note   I, Viviano Simas, connected with  Priscilla [name]  ([id number], Jul 13, 1996) on 09/09/23 at  6:00 PM EST by a video-enabled telemedicine application and verified that I am speaking with the correct person using two identifiers.  Location: Patient: Virtual Visit Location Patient:  Home Provider: Virtual Visit Location Provider: Home Office   I discussed the limitations of evaluation and management by telemedicine and the availability of in person appointments. The patient expressed understanding and agreed to proceed.    History of Present Illness: Priscilla [name] is a 27 y.o. 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 Keep on  for 12hrs, remove at bedtime, Disp: 28 patch, Rfl: 2   nicotine polacrilex (NICORETTE) 2 MG gum, Take 1 each (2 mg total) by mouth every 2 (two) hours while awake., Disp: 40 each, Rfl: 3  Observations/Objective: Patient is well-developed, well-nourished in no acute distress.  Resting comfortably  at home.  Head is normocephalic, atraumatic.  No labored breathing.  Speech is clear and coherent with logical content.  Patient is alert and oriented at baseline.    Assessment and Plan:  1. Tooth  pain (Primary)  Meds ordered this encounter  Medications   lidocaine (XYLOCAINE) 2 % solution    Sig: Swish and spit every 4-6 hours as needed for dental pain    Dispense:  100 mL    Refill:  0    Advised follow up with dentist if pain persists In person evaluation if stronger pain medication is needed  F/u with new concerns        Follow Up Instructions: I discussed the assessment and treatment plan with the patient. The patient was provided an opportunity to ask questions and all were answered. The patient agreed with the plan and demonstrated an understanding of the instructions.  A copy of instructions were sent to the patient via MyChart unless otherwise noted below.    The patient was advised to call back or seek an in-person evaluation if the symptoms worsen or if the condition fails to improve as anticipated.    Viviano Simas, FNP

## 2023-10-18 ENCOUNTER — Encounter: Payer: Medicaid Other | Admitting: Internal Medicine

## 2023-10-18 NOTE — Progress Notes (Deleted)
 Subjective:  CC: ***  HPI:  [name] is a 28 y.o. female with a past medical history of rhinitis, migraines, tobacco use who presents today for ***.  She establish care in March 2023 and has not been seen since then.  Last blood work showed mild anemia with hgb of 11.  CMP in 2024 with potassium of 3.3  Please see problem based assessment and plan for additional details.  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/2002   Headache    Infection    UTI    MEDICATIONS:  Nicotine replacement therapy  Family History  Problem Relation Age of Onset   Colon cancer Mother    Healthy Father    Asthma Sister    Diabetes Maternal Grandmother    COPD Neg Hx    Hypertension Neg Hx    Stroke Neg Hx     Social History   Socioeconomic History   Marital status: Single    Spouse name: Not on file   Number of children: Not on file   Years of education: Not on file   Highest education level: Not on file  Occupational History   Occupation: CNA    Employer: [employer]  Tobacco Use   Smoking status: Every Day    Current packs/day: 0.25    Average packs/day: 0.3 packs/day for 7.0 years (1.8 ttl pk-yrs)    Types: Cigarettes   Smokeless tobacco: Never   Tobacco comments:    2-3 cigs per day  Vaping Use   Vaping status: Never Used  Substance and Sexual Activity   Alcohol use: Not Currently    Comment: occasionally, prior to pregnancy   Drug use: Not Currently    Types: Marijuana    Comment: daily, 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   Sexual activity: Yes    Partners: Male    Birth control/protection: Implant  Other Topics Concern   Not on file  Social History Narrative   Not on file   Social Drivers of Health   Financial Resource Strain: Not on file  Food Insecurity: No Food Insecurity (03/25/2023)   Hunger Vital Sign    Worried About Running Out of Food in the Last Year: Never true    Ran Out of Food in the Last Year: Never  true  Transportation Needs: No Transportation Needs (03/25/2023)   PRAPARE - Administrator, Civil Service (Medical): No    Lack of Transportation (Non-Medical): No  Physical Activity: Not on file  Stress: Not on file  Social Connections: Not on file  Intimate Partner Violence: Not At Risk (03/25/2023)   Humiliation, Afraid, Rape, and Kick questionnaire    Fear of Current or Ex-Partner: No    Emotionally Abused: No    Physically Abused: No    Sexually Abused: No    Review of Systems: ROS negative except for what is noted on the assessment and plan.  Objective:  There were no vitals filed for this visit.  Physical Exam: Constitutional: well-appearing *** sitting in ***, in no acute distress HENT: normocephalic atraumatic, mucous membranes moist Eyes: conjunctiva non-erythematous Neck: supple Cardiovascular: regular rate and rhythm, no m/r/g Pulmonary/Chest: normal work of breathing on room air, lungs clear to auscultation bilaterally Abdominal: soft, non-tender, non-distended MSK: normal bulk and tone Neurological: alert & oriented x 3, 5/5 strength in bilateral upper and lower extremities, normal gait Skin: warm and dry Psych: ***  Assessment & Plan:  No problem-specific Assessment & Plan notes found for this encounter.    Patient {GC/GE:[id number]::"discussed with","seen with"} Dr. {[id number]::"HYQMVHQI","O. Hoffman","Mullen","Narendra","Machen","Vincent","Guilloud","Lau"}   [employer], D.O. [facility]  PGY-3 Pager: [phone]  Phone: [phone] Date 10/18/2023  Time 7:45 AM

## 2023-10-28 ENCOUNTER — Ambulatory Visit (HOSPITAL_COMMUNITY)
Admission: EM | Admit: 2023-10-28 | Discharge: 2023-10-28 | Discharge status: Against Medical Advice | Payer: Medicaid Other

## 2023-10-28 NOTE — ED Notes (Signed)
No answer in waiting area x2 

## 2024-04-28 ENCOUNTER — Encounter (HOSPITAL_COMMUNITY): Payer: Self-pay | Admitting: *Deleted

## 2024-04-28 ENCOUNTER — Ambulatory Visit (HOSPITAL_COMMUNITY)
Admission: EM | Admit: 2024-04-28 | Discharge: 2024-04-28 | Disposition: A | Discharge status: Home / Self Care | Attending: Family Medicine | Admitting: Family Medicine

## 2024-04-28 ENCOUNTER — Other Ambulatory Visit: Payer: Self-pay

## 2024-04-28 DIAGNOSIS — N912 Amenorrhea, unspecified: Secondary | ICD-10-CM

## 2024-04-28 DIAGNOSIS — R140 Abdominal distension (gaseous): Secondary | ICD-10-CM

## 2024-04-28 DIAGNOSIS — Z3202 Encounter for pregnancy test, result negative: Secondary | ICD-10-CM | POA: Diagnosis not present

## 2024-04-28 LAB — POCT URINE PREGNANCY: Preg Test, Ur: NEGATIVE

## 2024-04-28 NOTE — Discharge Instructions (Signed)
 You were seen today for bloating and gas.  Your pregnancy test was negative.  I recommend trial of over the counter Beano or Gas-x, drink plenty of fluids, and make sure you are having regular bowel movements.  Please return if not improving or follow up with your primary care provider.

## 2024-04-28 NOTE — ED Triage Notes (Addendum)
 PT is requesting a pregnancy test. Pt reports LMP was last month. Pt is not sure the last date of Period. Pt still has the implant for [facility]  and Pt reports her stomach is bloated.

## 2024-04-28 NOTE — ED Provider Notes (Signed)
 [facility]    CSN: [id number] Arrival date & time: 04/28/24  9170      History   Chief Complaint Chief Complaint  Patient presents with   Possible Pregnancy   SEXUALLY TRANSMITTED DISEASE    HPI Priscilla [name] is a 28 y.o. female.    Possible Pregnancy   Patient is here for pregnancy test.  Her stomach is bloated, but not on her period.  Her period was last month, but irregular.  She has the implanon .  She has been a bit gassy.  No constipation issues.       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 Authorizing Provider  lidocaine  (XYLOCAINE ) 2 % solution Swish and spit every 4-6 hours as needed for dental pain 09/09/23   Kennyth Domino, FNP  nicotine  (NICODERM CQ  - DOSED IN MG/24 HOURS) 14 mg/24hr patch Place 1 patch (14 mg total) onto the skin daily. Keep on for 12hrs, remove at bedtime 04/25/23   Ndulue, Chiagoziem J, MD  nicotine  polacrilex (NICORETTE ) 2 MG gum Take 1 each (2 mg total) by mouth every 2 (two) hours while awake. 04/25/23   Ndulue, Chiagoziem J, MD    Family History Family History  Problem Relation Age of Onset   Colon cancer Mother    Healthy Father    Asthma Sister    Diabetes  Maternal Grandmother    COPD Neg Hx    Hypertension Neg Hx    Stroke Neg Hx     Social History Social History   Tobacco Use   Smoking status: Every Day    Current packs/day: 0.25    Average packs/day: 0.3 packs/day for 7.0 years (1.8 ttl pk-yrs)    Types: Cigarettes   Smokeless tobacco: Never   Tobacco comments:    2-3 cigs per day  Vaping Use   Vaping status: Never Used  Substance Use Topics   Alcohol use: Not Currently    Comment: occasionally, prior to pregnancy   Drug use: Not Currently    Types: Marijuana    Comment: daily, 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     Allergies   Chocolate and Amoxil  [amoxicillin ]   Review of Systems Review of Systems  Constitutional: Negative.   HENT: Negative.    Respiratory: Negative.    Cardiovascular: Negative.   Gastrointestinal:  Positive for abdominal distention.  Genitourinary: Negative.  Musculoskeletal: Negative.   Psychiatric/Behavioral: Negative.       Physical Exam Triage Vital Signs ED Triage Vitals  Encounter Vitals Group     BP 04/28/24 1001 122/82     Girls Systolic BP Percentile --      Girls Diastolic BP Percentile --      Boys Systolic BP Percentile --      Boys Diastolic BP Percentile --      Pulse Rate 04/28/24 1001 (!) 105     Resp 04/28/24 1001 20     Temp 04/28/24 1001 98.4 F (36.9 C)     Temp src --      SpO2 04/28/24 1001 98 %     Weight --      Height --      Head Circumference --      Peak Flow --      Pain Score 04/28/24 1000 0     Pain Loc --      Pain Education --      Exclude from Growth Chart --    No data found.  Updated Vital Signs BP 122/82   Pulse (!) 105   Temp 98.4 F (36.9 C)   Resp 20   LMP  (LMP Unknown)   SpO2 98%   Breastfeeding No   Visual Acuity Right Eye Distance:   Left Eye Distance:   Bilateral Distance:    Right Eye Near:   Left Eye Near:    Bilateral Near:     Physical Exam Constitutional:      General: She is not in acute distress.    Appearance:  Normal appearance. She is normal weight. She is not ill-appearing or toxic-appearing.  Cardiovascular:     Rate and Rhythm: Normal rate and regular rhythm.  Pulmonary:     Effort: Pulmonary effort is normal.     Breath sounds: Normal breath sounds.  Abdominal:     Palpations: Abdomen is soft.     Tenderness: There is no abdominal tenderness. There is no guarding or rebound.  Musculoskeletal:     Cervical back: Normal range of motion and neck supple.  Neurological:     General: No focal deficit present.     Mental Status: She is alert.  Psychiatric:        Mood and Affect: Mood normal.      UC Treatments / Results  Labs (all labs ordered are listed, but only abnormal results are displayed) Labs Reviewed  POCT URINE PREGNANCY   UPT: negative  EKG   Radiology No results found.  Procedures Procedures (including critical care time)  Medications Ordered in UC Medications - No data to display  Initial Impression / Assessment and Plan / UC Course  I have reviewed the triage vital signs and the nursing notes.  Pertinent labs & imaging results that were available during my care of the patient were reviewed by me and considered in my medical decision making (see chart for details).   Final Clinical Impressions(s) / UC Diagnoses   Final diagnoses:  Amenorrhea  Bloating symptom     Discharge Instructions      You were seen today for bloating and gas.  Your pregnancy test was negative.  I recommend trial of over the counter Beano or Gas-x, drink plenty of fluids, and make sure you are having regular bowel movements.  Please return if not improving or follow up with your primary care provider.     ED Prescriptions   None  PDMP not reviewed this encounter.   Darral Longs, MD 04/28/24 1027

## 2024-06-29 ENCOUNTER — Ambulatory Visit: Admitting: Family Medicine

## 2024-07-02 ENCOUNTER — Telehealth: Admitting: Physician Assistant

## 2024-07-02 DIAGNOSIS — Z3009 Encounter for other general counseling and advice on contraception: Secondary | ICD-10-CM

## 2024-07-02 NOTE — Patient Instructions (Addendum)
 [email or web address]   *Center for [facility] at [facility]             [address] [place], [place] [address] [phone] (*Take patients with no insurance)  *Center for [employer] at [facility] [facility] [address] [place],  [place]  [address] [phone] (*Take patients with no insurance)  Center for [employer] at [employer]                                                             [address], [address], [place], [place], [address] [phone]  Center for [facility] at [facility] [address], [address], [place], [place], [address] [phone]  Center for [facility] at [facility] [address], [address], [place], [place], [address] [phone]  Center for [facility] at [facility]                                 [address] [place], [place], [place], [address] [phone]  Center for [employer] at [facility]                                    [address], [place], [place], [address] [phone]  Center for [facility] Healthcare at [facility] [address], [place], [place], [address]                              [facility] [address], [place], [place], [address] [phone]

## 2024-07-02 NOTE — Progress Notes (Signed)
 Wanting to schedule appt to get implant removed from arm as she is having abnormal menstrual cycles.   Did call her previous OB/GYN that placed it while she was in the hospital after giving birth, and they stated they did not take her type of Medicaid insurance.   Will send a list of [facility] options for patient to call for appt.   No Charge.
